# Patient Record
Sex: Male | Born: 1960 | Race: Black or African American | Hispanic: No | Marital: Married | Smoking: Former smoker
Health system: Southern US, Community
[De-identification: ages and names within clinical notes are randomized; demographics above are authoritative.]

## PROBLEM LIST (undated history)

## (undated) DIAGNOSIS — F419 Anxiety disorder, unspecified: Secondary | ICD-10-CM

## (undated) DIAGNOSIS — N529 Male erectile dysfunction, unspecified: Secondary | ICD-10-CM

## (undated) DIAGNOSIS — I10 Essential (primary) hypertension: Secondary | ICD-10-CM

## (undated) DIAGNOSIS — R079 Chest pain, unspecified: Secondary | ICD-10-CM

## (undated) DIAGNOSIS — E119 Type 2 diabetes mellitus without complications: Secondary | ICD-10-CM

## (undated) DIAGNOSIS — M797 Fibromyalgia: Secondary | ICD-10-CM

## (undated) DIAGNOSIS — I2510 Atherosclerotic heart disease of native coronary artery without angina pectoris: Secondary | ICD-10-CM

## (undated) DIAGNOSIS — E785 Hyperlipidemia, unspecified: Secondary | ICD-10-CM

## (undated) DIAGNOSIS — G4730 Sleep apnea, unspecified: Secondary | ICD-10-CM

## (undated) HISTORY — DX: Atherosclerotic heart disease of native coronary artery without angina pectoris: I25.10

## (undated) HISTORY — DX: Anxiety disorder, unspecified: F41.9

## (undated) HISTORY — PX: CHOLECYSTECTOMY: SHX55

## (undated) HISTORY — DX: Essential (primary) hypertension: I10

## (undated) HISTORY — DX: Hyperlipidemia, unspecified: E78.5

## (undated) HISTORY — DX: Chest pain, unspecified: R07.9

## (undated) HISTORY — DX: Fibromyalgia: M79.7

## (undated) HISTORY — DX: Sleep apnea, unspecified: G47.30

## (undated) HISTORY — DX: Male erectile dysfunction, unspecified: N52.9

## (undated) HISTORY — DX: Type 2 diabetes mellitus without complications: E11.9

---

## 1997-11-15 ENCOUNTER — Encounter: Payer: Self-pay | Admitting: Emergency Medicine

## 1997-11-15 ENCOUNTER — Ambulatory Visit (HOSPITAL_COMMUNITY): Admission: RE | Admit: 1997-11-15 | Discharge: 1997-11-15 | Payer: Self-pay

## 2001-06-14 ENCOUNTER — Encounter: Payer: Self-pay | Admitting: *Deleted

## 2001-06-14 ENCOUNTER — Ambulatory Visit: Admission: RE | Admit: 2001-06-14 | Discharge: 2001-06-15 | Payer: Self-pay | Admitting: *Deleted

## 2007-01-30 HISTORY — PX: CORONARY ANGIOPLASTY WITH STENT PLACEMENT: SHX49

## 2007-10-13 ENCOUNTER — Inpatient Hospital Stay (HOSPITAL_COMMUNITY): Admission: EM | Admit: 2007-10-13 | Discharge: 2007-10-15 | Payer: Self-pay | Admitting: Emergency Medicine

## 2008-05-29 HISTORY — PX: COLONOSCOPY: SHX174

## 2009-12-18 ENCOUNTER — Emergency Department (HOSPITAL_COMMUNITY): Admission: EM | Admit: 2009-12-18 | Discharge: 2009-12-18 | Payer: Self-pay | Admitting: Emergency Medicine

## 2010-06-13 NOTE — H&P (Signed)
NAME:  Clarence Jones, Clarence Jones NO.:  [id number]   MEDICAL RECORD NO.:  [id number]          PATIENT TYPE:  OBV   LOCATION:  3707                         FACILITY:  [facility]   PHYSICIAN:  Jake Bathe, MD      DATE OF BIRTH:  1960/07/09   DATE OF ADMISSION:  10/13/2007  DATE OF DISCHARGE:                              HISTORY & PHYSICAL   CHIEF COMPLAINT:  Consultation at the request of Dr. Wende Bushy  ED for the evaluation of chest pain.   HISTORY OF PRESENT ILLNESS:  A 50 year old African American male with  diabetes, hemoglobin A1c 6.0, hypertension well controlled with prior  stress test, nuclear, 1 year ago at the [facility] negative, who  presents to the emergency department today with increasing chest pain.  He walks daily and has noticed over the past few days an increase in  substernal chest pressure at the end of his walk.  He thought that it  may be gas.  Today at work, the pain intensified from 11 o'clock to  12:30 and then continued to 2 p.m.  It was 8/10 in intensity without any  radiation.  He did have some mild shortness of breath with this, but no  diaphoresis or nausea.  Occasional pounding/palpitation of the heart.  No syncope.  He complains of no bleeding problems.   Currently in the emergency department, he is resting comfortably beside  his daughter, doing well.   PAST MEDICAL HISTORY:  1. Diabetes - A1c 6.0.  2. Hypertension.  3. Obesity.  4. Hyperlipidemia - on pravastatin.  5. Prior tobacco use, quit 7 years ago.   PRIOR SURGICAL HISTORY:  Cholecystectomy in 1998.   ALLERGIES:  No known drug allergies.   MEDICATIONS:  1. Glipizide 2.5/metformin 250 mg twice a day.  2. Neurontin, unknown dosage.  3. Pravastatin 10 mg once a day.  4. Aspirin 81 mg once a day.  5. Viagra p.r.n.  6. Lisinopril/hydrochlorothiazide 20/25 mg once a day.   SOCIAL HISTORY:  No tobacco use, quit 7 years ago.  Rare alcohol use.  No drug use.  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  He currently works [place] the [place] fueling [place] with  [employer].   FAMILY HISTORY:  He has a daughter here with him.   FAMILY HISTORY:  Mother died of diabetes at age 97.  Father died of  pancreatic cancer at age 6.   REVIEW OF SYSTEMS:  Unless explained above, all other 12 review of  systems is negative.   PHYSICAL EXAMINATION:  VITAL SIGNS:  Pulse 65, blood pressure 138/82,  satting 100% on room air.  Occasional ectopy noted on telemetry.  GENERAL:  Alert and oriented x3 in no acute distress, resting  comfortably.  HEENT:  Eyes, well-perfused conjunctivae.  EOMI.  No scleral icterus.  NECK:  Thick.  No carotid bruits.  No JVD.  Normal upstroke.  CARDIOVASCULAR:  Regular rate and rhythm without any murmurs, rubs, or  gallops.  Normal PMI.  LUNGS:  Clear to auscultation bilaterally with normal  respiratory  effort.  ABDOMEN:  Obese.  Positive bowel sounds, nontender.  No bruits heard.  EXTREMITIES:  No clubbing, cyanosis, or edema.  Femoral pulses 2+  bilaterally with 2+ dorsalis pedis pulses bilaterally.  SKIN:  Warm,  dry, and intact.  No rashes noted.  NEUROLOGIC:  Nonfocal.  No tremors.  PSYCHIATRIC:  Normal affect.   LABORATORY DATA:  Sodium 140, potassium 3.8, BUN 17, creatinine 1.4,  glucose 199.  Hemoglobin 15, hematocrit 44.  First set of cardiac  biomarkers are normal.  White blood cell count 6.2, platelet count 409.  Chest x-ray shows no acute chest process.  This one was personally  reviewed.   ASSESSMENT AND PLAN:  1. Chest pain - concerning for unstable angina - earlier today, it was      relieved with nitroglycerin in the emergency department.  Given his      diabetes, hypertension, hyperlipidemia, obesity, and recent normal      nuclear stress test, I will proceed with cardiac catheterization in      the morning.  Risk and benefits including stroke, heart attack, and      death had been  explained to both the patient and his daughter at      length.  Currently, reassuring cardiac biomarkers.  We will go      ahead and place on heparin per pharmacy protocol.  We will also      place on low-dose metoprolol and continue lisinopril and      hydrochlorothiazide.  2. Hyperlipidemia - we will place on simvastatin 40 mg once a day in      lieu of his pravastatin 10 mg once a day.  May change as an      outpatient back to pravastatin.  We will check fasting lipid      profile.  3. Diabetes - we will place on insulin sliding scale.  Hold his      metformin.  4. Erectile dysfunction - counseled on Viagra use in the setting of      nitroglycerin.  He has not taken any Viagra recently.   CC: Dr Wende Bushy ED.      Jake Bathe, MD  Electronically Signed     MCS/MEDQ  D:  10/13/2007  T:  10/14/2007  Job:  [id number]

## 2010-06-13 NOTE — Discharge Summary (Signed)
NAME:  Clarence Jones, Clarence Jones NO.:  [id number]   MEDICAL RECORD NO.:  [id number]          PATIENT TYPE:  OBV   LOCATION:  6529                         FACILITY:  [facility]   PHYSICIAN:  Hollice Espy, M.D.DATE OF BIRTH:  Apr 05, 1960   DATE OF ADMISSION:  10/13/2007  DATE OF DISCHARGE:                               DISCHARGE SUMMARY   CONSULTANT ON THIS CASE:  Mark C. Anne Fu, MD, of cardiology.   PRIMARY CARE PHYSICIAN:  Oley Balm. Georgina Pillion, MD.   DISCHARGE DIAGNOSES:  1. Coronary artery disease status post cardiac catheterization with      placement of a drug-eluting stent to the mid-circumflex with no      complications.  2. Diabetes mellitus.  3. Hypertension.  4. Obesity.   DISCHARGE MEDICATIONS:  New medicine:  Plavix 75 p.o. daily x1 year.   The patient will continue all of his previous medications as follows:  1. Glipizide/metformin 2.5/250 p.o. b.i.d.  2. Pravachol 10 p.o. daily.  3. Aspirin 81 p.o. daily.  4. Viagra p.r.n.  5. Lisinopril 20 p.o. daily.  6. Hydrochlorothiazide 25 p.o. daily.   HOSPITAL COURSE:  The patient is a 50 year old African American male  with past medical history of diabetes and hypertension who came in  complaining of chest pain, which had been persisting for the past 12  hours prior to coming in.  He had had intermittent episodes and actually  had a stress test 1 year prior at an outside facility.  The patient was  not able to ever find out the results of that stress test but because of  the current episodes of pain, it was felt best that he go for cardiac  catheterization.  [facility] was consulted, saw the patient and  took him for cardiac catheterization today.  In the interim the patient  has had negative enzymes x3.  During the patient's cardiac  catheterization there were no complications.  He was found to have 70%  stenosis of the circumflex and he underwent a successful placement of a  drug-eluting stent.  Postop he  was seen in cath holding and then sent up  to the floor.  He tolerated it well and currently he is alert and  oriented with no complaints.  The plan will be to remove his sheath this  afternoon and discharge him 24 hours after stent placement.  The patient  has already been taking aspirin.  He was started on Plavix for 1 year's  time.  He will have a follow-up appointment with his cardiologist, Dr.  Donato Schultz of [facility], in the next 1-2 weeks as recommended  them and follow up with is PCP, Dr. Lajean Manes, in 2-3 weeks.  He is  cleared to return back to work in several days.  His activity will be  slow to increase.  His discharge diet will be a carb-modified, heart-  healthy diet.   DISPOSITION:  Improved.  He is being discharged to home.  The rest of  his medical issues were stable during this hospitalization.      Hollice Espy, M.D.  Electronically  Signed     SKK/MEDQ  D:  10/14/2007  T:  10/14/2007  Job:  [id number]   cc:   Jake Bathe, MD  Oley Balm Georgina Pillion, M.D.

## 2010-06-13 NOTE — H&P (Signed)
NAME:  Clarence Jones, Clarence Jones NO.:  [id number]   MEDICAL RECORD NO.:  [id number]          PATIENT TYPE:  EMS   LOCATION:  MAJO                         FACILITY:  [facility]   PHYSICIAN:  Hollice Espy, M.D.DATE OF BIRTH:  31-Aug-1960   DATE OF ADMISSION:  10/13/2007  DATE OF DISCHARGE:                              HISTORY & PHYSICAL   PRIMARY CARE PHYSICIAN:  Dr. Lajean Manes.   CONSULTANTS ON THIS CASE:  Dr. Donato Schultz, cardiology.   CHIEF COMPLAINT:  Chest pain.   HISTORY OF PRESENT ILLNESS:  The patient is a 50 year old African  American male with past medical history of hyperlipidemia, hypertension  and diabetes mellitus, who approximately a year ago at an outside city  and hospital through the [place] ended up getting a stress test for chest  pain.  He was never told the results, but assumed it was negative  because he never got a call back.  Since that time, he has had  intermittent episodes of chest pain which he describes as sharp pain  just left of the mid sternum.  He started having similar symptoms again  last night.  He went to work today, but then when the symptoms started  to come back and persist, he became concerned and called a friend who  advised him to come into the hospital.  In the emergency room, his first  set of cardiac markers were unremarkable.  He was given some aspirin,  which he said helped relieve some of these chest pain episodes.  He also  did note no radiation of this pain, but did note some associated  shortness of breath.  Currently, he is doing well.  He denies any  headaches, vision changes, dysphagia.  No current chest pain, no  palpitations.  No shortness of breath, wheeze or cough currently.  No  abdominal pain.  No hematuria, dysuria, constipation, diarrhea, focal  extremity numbness, weakness or pain.  The patient had lab work done,  all of which is also unremarkable as well.  His EKG showed a normal  sinus rhythm.   PAST MEDICAL  HISTORY:  1. Diabetes mellitus.  2. Hypertension.  3. Hyperlipidemia.   MEDICATIONS:  1. Glipizide 2.5/250 for metformin p.o. b.i.d.  2. Neurontin p.o. daily.  3. Pravastatin 10 p.o. daily.  4. Aspirin 81.  5. Viagra p.r.n.  6. Lisinopril/hydrochlorothiazide 20/25 p.o. daily.   ALLERGIES:  HE HAS NO KNOWN DRUG ALLERGIES.   SOCIAL HISTORY:  No tobacco, alcohol or drug use.   FAMILY HISTORY:  Positive for cancer, not for CAD.   PHYSICAL EXAMINATION:  VITAL SIGNS:  On admission, temperature 98.1,  heart rate 67, blood pressure 132/76, respirations 18, O2 sat 99% on 2  liters.  GENERAL:  He is alert and oriented x3 in no apparent distress.  HEENT:  Normocephalic, atraumatic.  Mucous membranes are moist.  NECK:  No carotid bruits.  HEART:  Regular rate and rhythm.  S1-S2.  LUNGS:  Clear to auscultation bilaterally.  ABDOMEN:  Soft, nontender, nondistended.  Positive bowel sounds.  EXTREMITIES:  Show no clubbing or cyanosis.  He  has trace pitting edema.   LABORATORY DATA:  Chest x-ray shows no acute chest process.  EKG is as  per HPI.  White count is 6.2.  Hemoglobin and hematocrit 14.3 and 42.  MCV of 90, platelet count 409, no shift.  CPK 53.7, MB less than 1.  Troponin-I less than 0.05.  Sodium 140, potassium 3.8, chloride 105, BUN  17, creatinine 1.4, glucose 99.   ASSESSMENT/PLAN:  1. Chest pain in a patient with history of diabetes, hyperlipidemia,      hypertension, recurrent with previously questionable negative      stress test 1-year ago in a different city.  I discussed with Dr.      Dawna Part of cardiology.  He will see the patient and likely the      patient will go for cardiac catheterization in the a.m.  2. Diabetes mellitus.  Continue medications, sliding scale.  Change to      q.4 h., sliding scale when he is n.p.o.  3. Hypertension.  Continue medications.  4. Renal insufficiency - mild, likely chronic.      Hollice Espy, M.D.  Electronically  Signed     SKK/MEDQ  D:  10/13/2007  T:  10/13/2007  Job:  [id number]   cc:   Oley Balm. Georgina Pillion, M.D.  Dawna Part, MD

## 2010-10-30 LAB — HEMOGLOBIN A1C
Hgb A1c MFr Bld: 6.2 — ABNORMAL HIGH
Mean Plasma Glucose: 131

## 2010-10-30 LAB — BASIC METABOLIC PANEL
BUN: 13
BUN: 9
CO2: 30
Calcium: 9.2
Calcium: 9.3
Chloride: 106
Creatinine, Ser: 1.13
GFR calc Af Amer: >60
GFR calc non Af Amer: >60
GFR calc non Af Amer: >60
Glucose, Bld: 105 — ABNORMAL HIGH
Glucose, Bld: 107 — ABNORMAL HIGH
Potassium: 3.5
Potassium: 3.8
Sodium: 137
Sodium: 139

## 2010-10-30 LAB — LIPID PANEL
Cholesterol: 207 — ABNORMAL HIGH
HDL: 35 — ABNORMAL LOW
LDL Cholesterol: 151 — ABNORMAL HIGH
Triglycerides: 103

## 2010-10-30 LAB — CBC
HCT: 43.5
HCT: 43.6
MCHC: 33.3
MCV: 91
Platelets: 357
Platelets: 380
RDW: 12.6
WBC: 6.9

## 2010-10-30 LAB — GLUCOSE, CAPILLARY
Glucose-Capillary: 107 — ABNORMAL HIGH
Glucose-Capillary: 90

## 2010-10-30 LAB — HEPARIN LEVEL (UNFRACTIONATED): Heparin Unfractionated: 1.11 — ABNORMAL HIGH

## 2010-11-01 LAB — MAGNESIUM: Magnesium: 2.1

## 2010-11-01 LAB — CK TOTAL AND CKMB (NOT AT ARMC)
CK, MB: 1.1
Relative Index: 0.3
Total CK: 421 — ABNORMAL HIGH

## 2010-11-01 LAB — DIFFERENTIAL
Basophils Absolute: 0
Lymphocytes Relative: 28
Lymphs Abs: 1.7
Neutro Abs: 4
Neutrophils Relative %: 65

## 2010-11-01 LAB — COMPREHENSIVE METABOLIC PANEL
AST: 20
CO2: 29
Chloride: 106
Creatinine, Ser: 1.18
GFR calc Af Amer: >60
GFR calc non Af Amer: >60
Glucose, Bld: 104 — ABNORMAL HIGH
Total Bilirubin: 1.1

## 2010-11-01 LAB — CBC
Platelets: 409 — ABNORMAL HIGH
RDW: 13
WBC: 6.2

## 2010-11-01 LAB — POCT I-STAT, CHEM 8
BUN: 17
Chloride: 105
Creatinine, Ser: 1.4
Potassium: 3.8
Sodium: 140
TCO2: 27

## 2010-11-01 LAB — PROTIME-INR: INR: 1

## 2010-11-01 LAB — POCT CARDIAC MARKERS
CKMB, poc: <1 — ABNORMAL LOW
Myoglobin, poc: 53.7
Troponin i, poc: <0.05

## 2011-05-10 IMAGING — CR DG HAND COMPLETE 3+V*R*
3 series · 3 of 3 positions shown · non-contrast
Comparison: None.

CLINICAL DATA: Laceration of the index finger.

RIGHT HAND - COMPLETE 3+ VIEW

[x hand ap right]
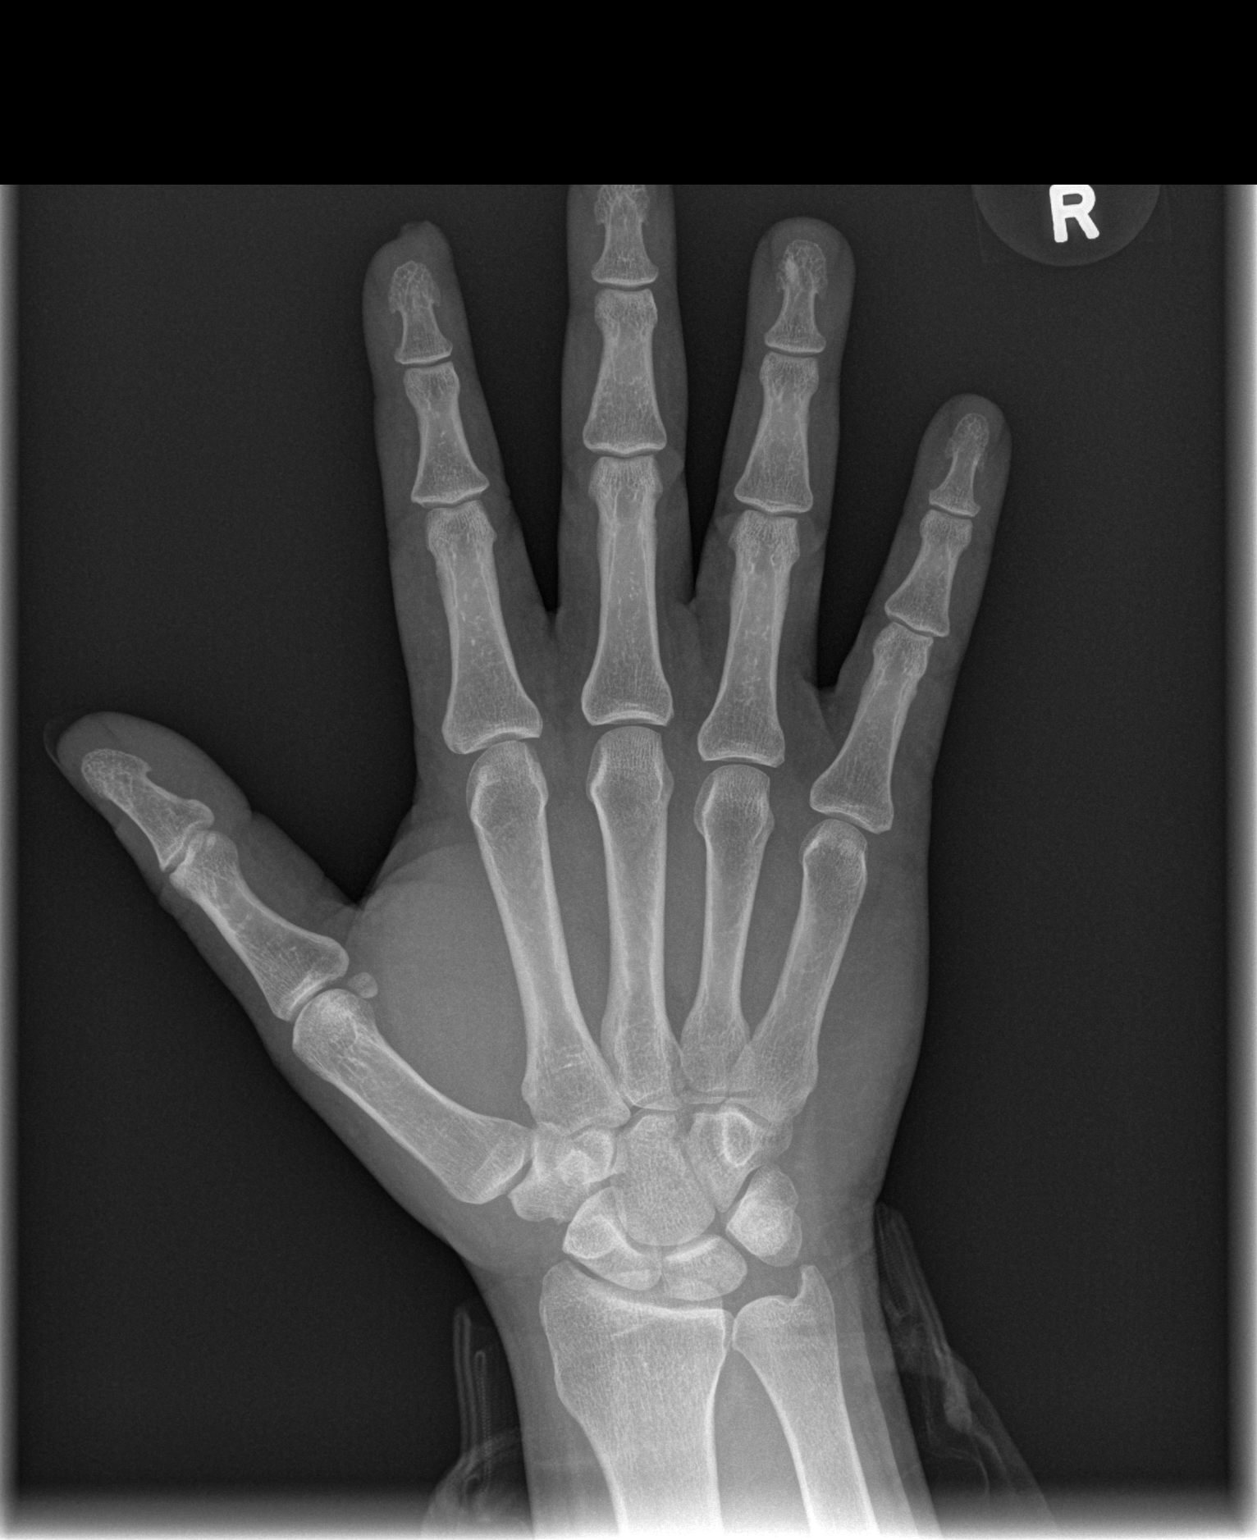

[x hand oblique right]
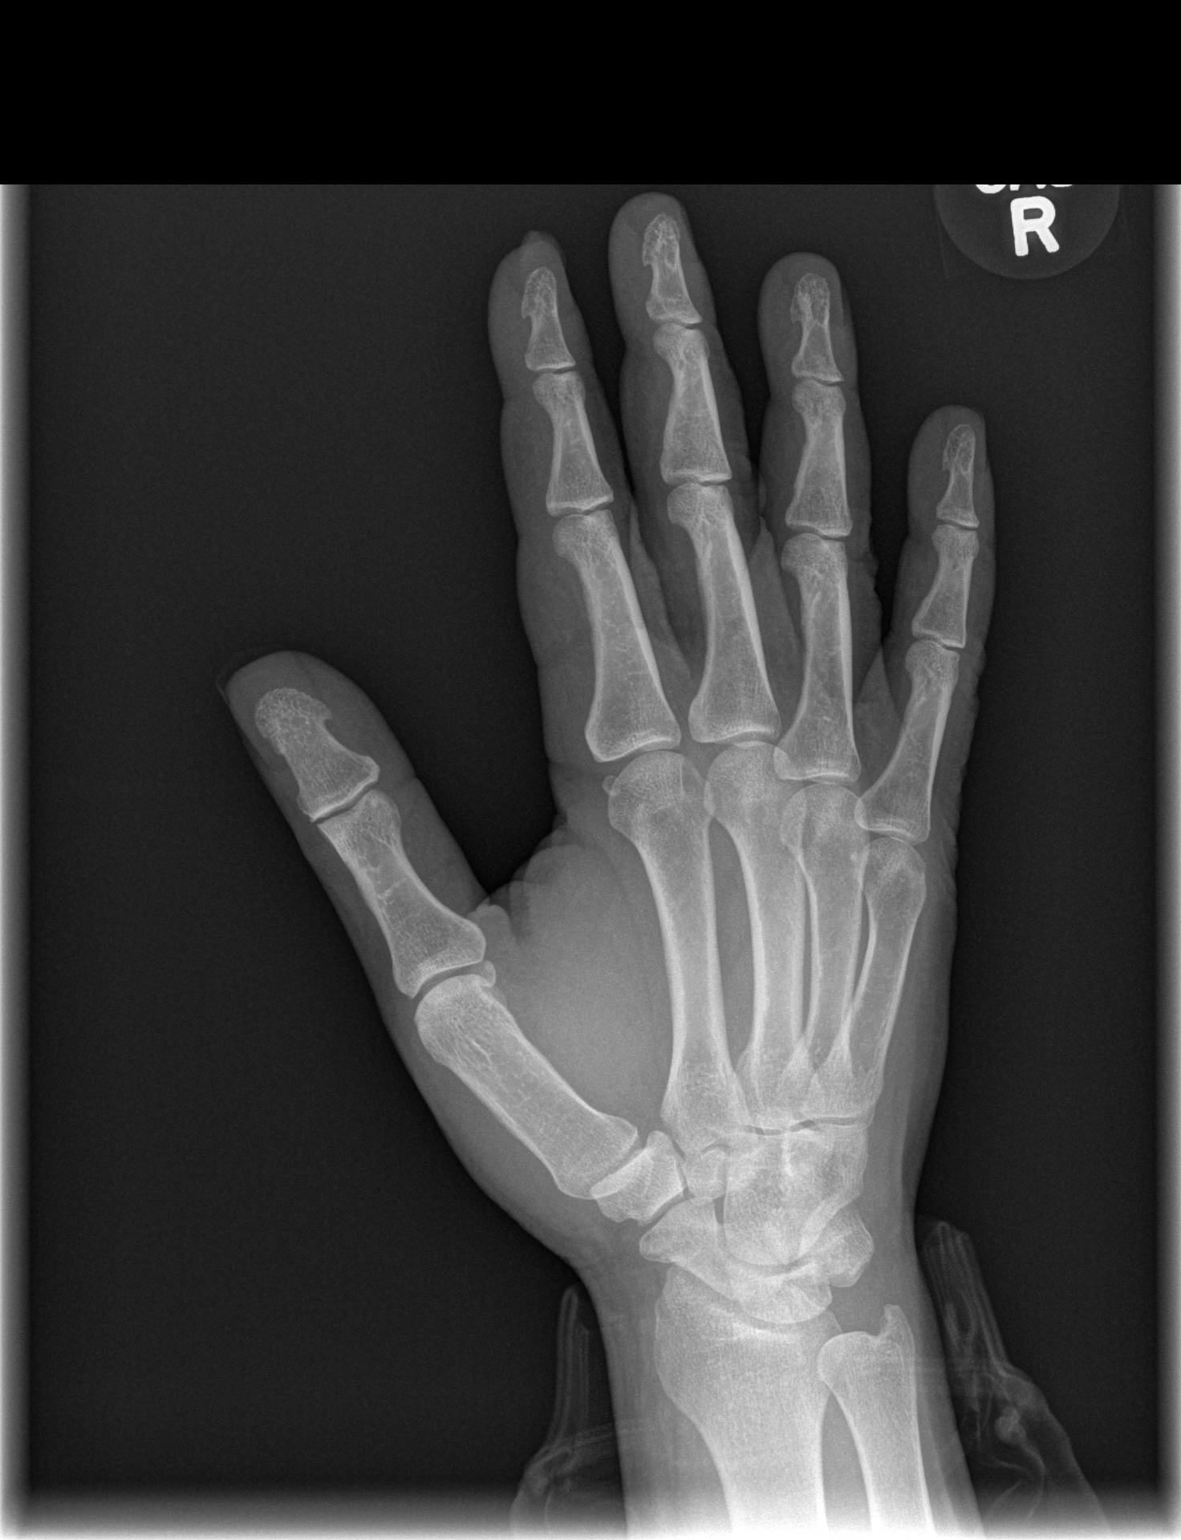

[x hand lat right]
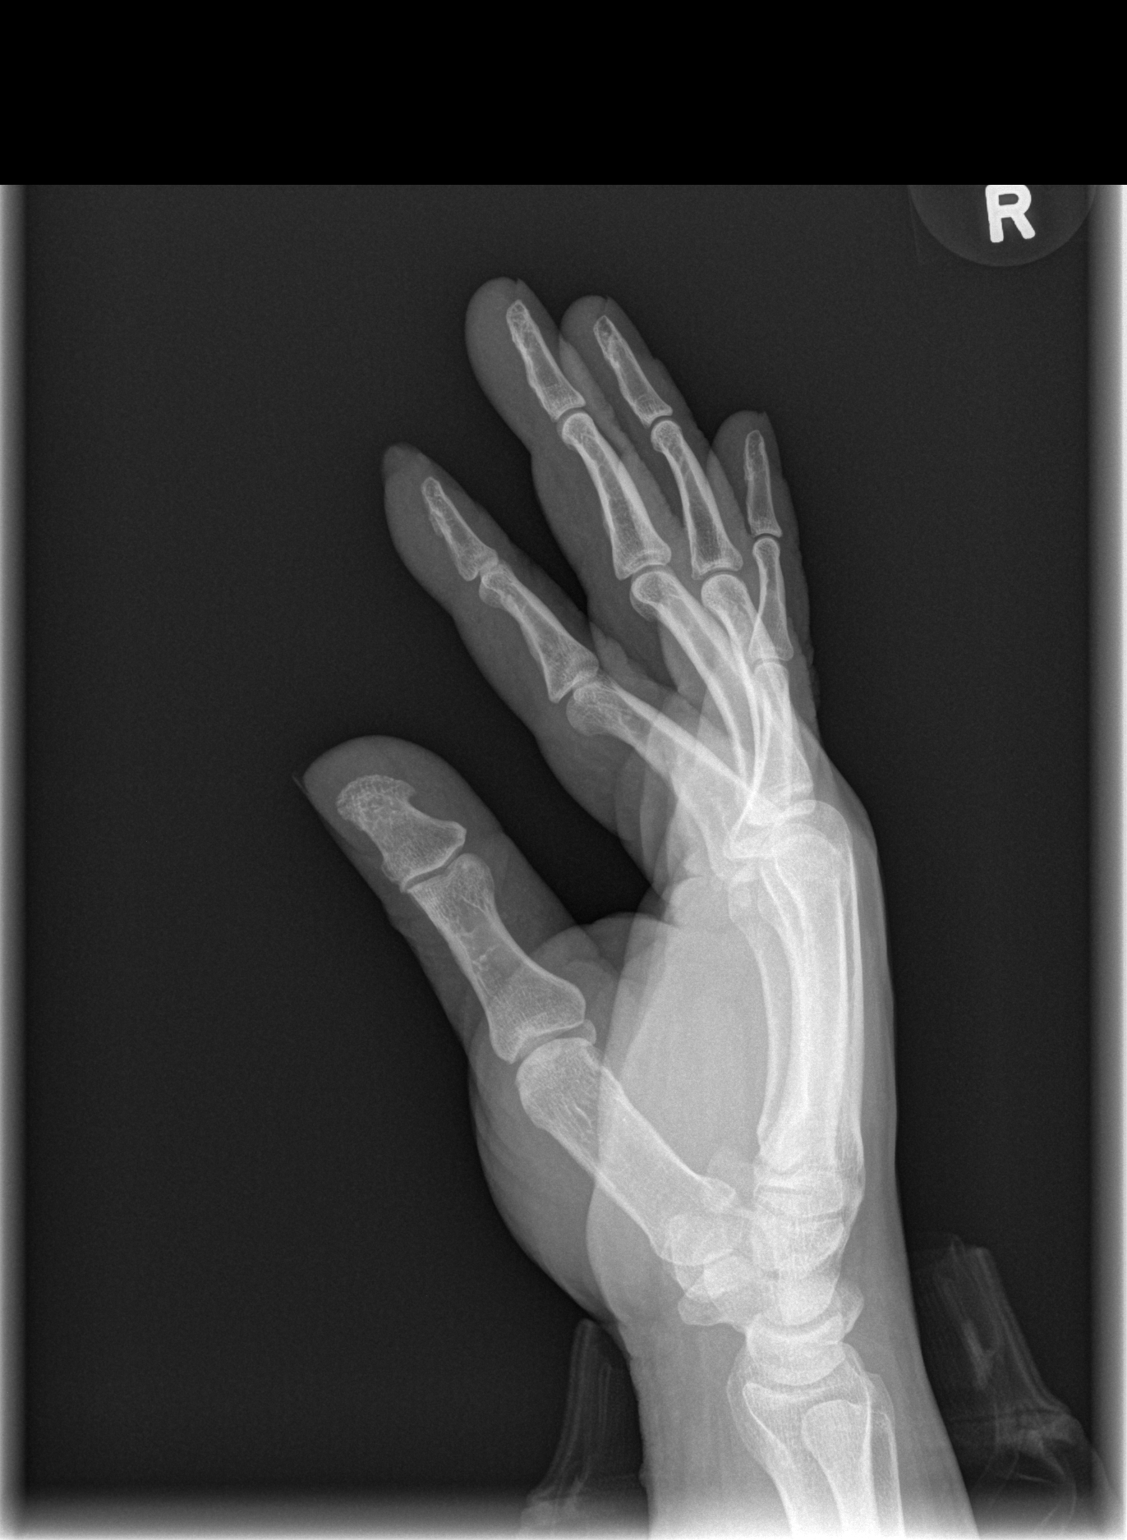

[3 of 3 positions shown; findings below may reference images not displayed]

FINDINGS: Soft tissue defect is seen over the tip of the index
finger.  No underlying fracture or foreign body.
IMPRESSION: Laceration of the index finger.  No fracture or foreign body.

## 2012-11-27 ENCOUNTER — Telehealth: Payer: Self-pay

## 2012-11-27 MED ORDER — LISINOPRIL-HYDROCHLOROTHIAZIDE 20-25 MG PO TABS: 1.0000 | ORAL_TABLET | Freq: Every day | ORAL | Status: DC

## 2012-11-27 NOTE — Telephone Encounter (Signed)
refill 

## 2012-12-23 ENCOUNTER — Telehealth: Payer: Self-pay

## 2012-12-23 DIAGNOSIS — E785 Hyperlipidemia, unspecified: Secondary | ICD-10-CM

## 2012-12-23 MED ORDER — ATORVASTATIN CALCIUM 40 MG PO TABS: 40.0000 mg | ORAL_TABLET | Freq: Every day | ORAL | Status: DC

## 2012-12-23 NOTE — Telephone Encounter (Signed)
Done

## 2013-01-06 ENCOUNTER — Encounter: Payer: Self-pay | Admitting: *Deleted

## 2013-01-06 ENCOUNTER — Encounter: Payer: Self-pay | Admitting: Cardiology

## 2013-01-06 DIAGNOSIS — I2510 Atherosclerotic heart disease of native coronary artery without angina pectoris: Secondary | ICD-10-CM | POA: Insufficient documentation

## 2013-01-06 DIAGNOSIS — R079 Chest pain, unspecified: Secondary | ICD-10-CM | POA: Insufficient documentation

## 2013-01-06 DIAGNOSIS — I10 Essential (primary) hypertension: Secondary | ICD-10-CM | POA: Insufficient documentation

## 2013-01-06 DIAGNOSIS — E785 Hyperlipidemia, unspecified: Secondary | ICD-10-CM | POA: Insufficient documentation

## 2013-01-06 DIAGNOSIS — E119 Type 2 diabetes mellitus without complications: Secondary | ICD-10-CM | POA: Insufficient documentation

## 2013-01-06 DIAGNOSIS — N529 Male erectile dysfunction, unspecified: Secondary | ICD-10-CM | POA: Insufficient documentation

## 2013-01-07 ENCOUNTER — Encounter: Payer: Self-pay | Admitting: Cardiology

## 2013-01-07 ENCOUNTER — Ambulatory Visit (INDEPENDENT_AMBULATORY_CARE_PROVIDER_SITE_OTHER): Payer: BC Managed Care – PPO | Admitting: Cardiology

## 2013-01-07 VITALS — BP 136/80 | HR 77 | Ht 68.0 in | Wt 201.0 lb

## 2013-01-07 DIAGNOSIS — E119 Type 2 diabetes mellitus without complications: Secondary | ICD-10-CM

## 2013-01-07 DIAGNOSIS — I10 Essential (primary) hypertension: Secondary | ICD-10-CM

## 2013-01-07 DIAGNOSIS — E785 Hyperlipidemia, unspecified: Secondary | ICD-10-CM

## 2013-01-07 DIAGNOSIS — I2510 Atherosclerotic heart disease of native coronary artery without angina pectoris: Secondary | ICD-10-CM

## 2013-01-07 NOTE — Patient Instructions (Signed)
Your physician recommends that you continue on your current medications as directed. Please refer to the Current Medication list given to you today.  Your physician recommends that you schedule a follow-up appointment in: 3 months with Dr. Skians 

## 2013-01-07 NOTE — Progress Notes (Signed)
[address] [place], [place]  [address] Phone: [phone] Fax:  [phone]  Date:  01/07/2013   ID:  Clarence Jones, DOB 11/19/1960, MRN [id number]  PCP:  No primary provider on file.   History of Present Illness: Clarence Jones is a 52 y.o. male with coronary artery disease, proximal stent in the circumflex artery placed in September 2009, diabetes, hypertension, hyperlipidemia here for followup. He's not having any exertional anginal symptoms, doing well. He is compliant with his medications. He is quite active, lifts weights. His hemoglobin A1c is much better at 6.5.  We discussed his creatinine last check of 1.38. Avoidance of NSAIDs. We discussed his weight gain of 7 pounds. He has been working out. No anginal symptoms. He does have occasional shortness of breath when going up stairs but this is no change for him. He is able to exercise quite vigorously on treadmill without any difficulty. Still weightlifting.  Sleep study poor. Getting fitting.     Wt Readings from Last 3 Encounters:  01/07/13 201 lb (91.173 kg)     Past Medical History  Diagnosis Date  . Fibromyalgia   . Coronary atherosclerosis of native coronary artery   . HTN (hypertension)   . Hyperlipidemia   . Erectile dysfunction   . Diabetes   . HTN (hypertension)   . Chest pain     ETT 12/09 - 10 min. no ECG changes.     Past Surgical History  Procedure Laterality Date  . Cholecystectomy      Current Outpatient Prescriptions  Medication Sig Dispense Refill  . aspirin 81 MG tablet Take 81 mg by mouth daily.      Marland Kitchen aspirin-acetaminophen-caffeine (EXCEDRIN MIGRAINE) 250-250-65 MG per tablet Take 1 tablet by mouth as needed for headache.      Marland Kitchen atorvastatin (LIPITOR) 40 MG tablet Take 1 tablet (40 mg total) by mouth daily.  90 tablet  1  . carvedilol (COREG) 6.25 MG tablet Take 6.25 mg by mouth 2 (two) times daily with a meal.      . cetirizine (ZYRTEC) 10 MG tablet Take 10 mg by  mouth daily as needed for allergies.      Marland Kitchen gabapentin (NEURONTIN) 300 MG capsule Take 300 mg by mouth 3 (three) times daily.      Marland Kitchen glipiZIDE (GLUCOTROL) 5 MG tablet Take 5 mg by mouth daily before breakfast.      . hydrocortisone (ANUSOL-HC) 2.5 % rectal cream Place 1 application rectally 2 (two) times daily.      Marland Kitchen lisinopril-hydrochlorothiazide (PRINZIDE,ZESTORETIC) 20-25 MG per tablet Take 1 tablet by mouth daily.  30 tablet  5  . metFORMIN (GLUCOPHAGE) 500 MG tablet Take 2,000 mg by mouth daily.      . naproxen (NAPROSYN) 500 MG tablet Take 500 mg by mouth as needed.      . nitroGLYCERIN (NITROSTAT) 0.4 MG SL tablet Place 0.4 mg under the tongue every 5 (five) minutes as needed for chest pain.      . sildenafil (VIAGRA) 100 MG tablet Take 100 mg by mouth daily as needed for erectile dysfunction.      . traMADol (ULTRAM) 50 MG tablet Take 50 mg by mouth every 6 (six) hours as needed.       No current facility-administered medications for this visit.    Allergies:   No Known Allergies  Social History:  The patient  reports that he has quit smoking. He does not have any  smokeless tobacco history on file. He reports that he drinks alcohol. He reports that he does not use illicit drugs.   ROS:  Please see the history of present illness.   Denies any bleeding, syncope, orthopnea, PND  PHYSICAL EXAM: VS:  BP 136/80  Pulse 77  Ht 5\' 8"  (1.727 m)  Wt 201 lb (91.173 kg)  BMI 30.57 kg/m2 Well nourished, well developed, in no acute distress HEENT: normal Neck: no JVD Cardiac:  normal S1, S2; RRR; no murmur Lungs:  clear to auscultation bilaterally, no wheezing, rhonchi or rales Abd: soft, nontender, no hepatomegaly Ext: no edema Skin: warm and dry Neuro: no focal abnormalities noted  EKG:  Sinus rhythm, T wave inversion in various leads, no change from prior     ASSESSMENT AND PLAN:  1. OSA - getting fitting CPAP.  2. CAD - stable. Circumflex stent. No Angina.  3. HTN - OK with  current readings.  4. DM - diet controlled. Glipizide and metformin. 6.8 from 7.4. 5. HL - atorvastatin 40mg . LDL 83 at last check.  6. Three-month followup  Signed, Donato Schultz, MD [facility]  01/07/2013 10:16 AM

## 2013-04-07 ENCOUNTER — Ambulatory Visit (INDEPENDENT_AMBULATORY_CARE_PROVIDER_SITE_OTHER): Payer: BC Managed Care – PPO | Admitting: Cardiology

## 2013-04-07 ENCOUNTER — Encounter: Payer: Self-pay | Admitting: Cardiology

## 2013-04-07 VITALS — BP 148/85 | HR 67 | Ht 68.0 in | Wt 201.0 lb

## 2013-04-07 DIAGNOSIS — E119 Type 2 diabetes mellitus without complications: Secondary | ICD-10-CM

## 2013-04-07 DIAGNOSIS — N529 Male erectile dysfunction, unspecified: Secondary | ICD-10-CM

## 2013-04-07 DIAGNOSIS — I2510 Atherosclerotic heart disease of native coronary artery without angina pectoris: Secondary | ICD-10-CM

## 2013-04-07 DIAGNOSIS — E785 Hyperlipidemia, unspecified: Secondary | ICD-10-CM

## 2013-04-07 DIAGNOSIS — I10 Essential (primary) hypertension: Secondary | ICD-10-CM

## 2013-04-07 MED ORDER — LISINOPRIL-HYDROCHLOROTHIAZIDE 20-25 MG PO TABS: 1.0000 | ORAL_TABLET | Freq: Every day | ORAL | Status: DC

## 2013-04-07 MED ORDER — ATORVASTATIN CALCIUM 40 MG PO TABS: 40.0000 mg | ORAL_TABLET | Freq: Every day | ORAL | Status: DC

## 2013-04-07 MED ORDER — CARVEDILOL 6.25 MG PO TABS: 6.2500 mg | ORAL_TABLET | Freq: Two times a day (BID) | ORAL | Status: DC

## 2013-04-07 NOTE — Patient Instructions (Signed)
Your physician recommends that you continue on your current medications as directed. Please refer to the Current Medication list given to you today.  Your physician wants you to follow-up in: 6 months with Dr. Skains. You will receive a reminder letter in the mail two months in advance. If you don't receive a letter, please call our office to schedule the follow-up appointment.  

## 2013-04-07 NOTE — Progress Notes (Signed)
[place], [address] [place], [place]  [address] Phone: [phone] Fax:  [phone]  Date:  04/07/2013   ID:  Clarence Jones, DOB [birth year], MRN [id number]  PCP:  No primary provider on file.   History of Present Illness: Clarence Jones is a 53 y.o. male with coronary artery disease, proximal stent in the circumflex artery placed in September 2009, diabetes, hypertension, hyperlipidemia here for followup. He's not having any exertional anginal symptoms, doing well. He is compliant with his medications. He is quite active, lifts weights. His hemoglobin A1c is much better at 6.5.  We discussed his creatinine last check of 1.38. Avoidance of NSAIDs. We discussed his weight gain of 7 pounds. He has been working out. No anginal symptoms. He does have occasional shortness of breath when going up stairs but this is no change for him. He is able to exercise quite vigorously on treadmill without any difficulty. Still weightlifting.  Sleep study poor. Getting fitting.     Wt Readings from Last 3 Encounters:  04/07/13 201 lb (91.173 kg)  01/07/13 201 lb (91.173 kg)     Past Medical History  Diagnosis Date  . Fibromyalgia   . Coronary atherosclerosis of native coronary artery   . HTN (hypertension)   . Hyperlipidemia   . Erectile dysfunction   . Diabetes   . HTN (hypertension)   . Chest pain     ETT 12/09 - 10 min. no ECG changes.     Past Surgical History  Procedure Laterality Date  . Cholecystectomy      Current Outpatient Prescriptions  Medication Sig Dispense Refill  . aspirin 81 MG tablet Take 81 mg by mouth daily.      Marland Kitchen aspirin-acetaminophen-caffeine (EXCEDRIN MIGRAINE) 250-250-65 MG per tablet Take 1 tablet by mouth as needed for headache.      Marland Kitchen atorvastatin (LIPITOR) 40 MG tablet Take 1 tablet (40 mg total) by mouth daily.  90 tablet  1  . carvedilol (COREG) 6.25 MG tablet Take 6.25 mg by mouth 2 (two) times daily with a meal.      . cetirizine  (ZYRTEC) 10 MG tablet Take 10 mg by mouth daily as needed for allergies.      Marland Kitchen gabapentin (NEURONTIN) 300 MG capsule Take 300 mg by mouth 3 (three) times daily.      Marland Kitchen glipiZIDE (GLUCOTROL) 5 MG tablet Take 5 mg by mouth daily before breakfast.      . hydrocortisone (ANUSOL-HC) 2.5 % rectal cream Place 1 application rectally 2 (two) times daily.      Marland Kitchen lisinopril-hydrochlorothiazide (PRINZIDE,ZESTORETIC) 20-25 MG per tablet Take 1 tablet by mouth daily.  30 tablet  5  . metFORMIN (GLUCOPHAGE) 500 MG tablet Take 2,000 mg by mouth daily.      . naproxen (NAPROSYN) 500 MG tablet Take 500 mg by mouth as needed.      . nitroGLYCERIN (NITROSTAT) 0.4 MG SL tablet Place 0.4 mg under the tongue every 5 (five) minutes as needed for chest pain.      . sildenafil (VIAGRA) 100 MG tablet Take 100 mg by mouth daily as needed for erectile dysfunction.      . traMADol (ULTRAM) 50 MG tablet Take 50 mg by mouth every 6 (six) hours as needed.       No current facility-administered medications for this visit.    Allergies:   No Known Allergies  Social History:  The patient  reports that he has  quit smoking. He does not have any smokeless tobacco history on file. He reports that he drinks alcohol. He reports that he does not use illicit drugs.   ROS:  Please see the history of present illness.   Denies any bleeding, syncope, orthopnea, PND  PHYSICAL EXAM: VS:  BP 148/85  Pulse 67  Ht 5\' 8"  (1.727 m)  Wt 201 lb (91.173 kg)  BMI 30.57 kg/m2 Well nourished, well developed, in no acute distress HEENT: normal Neck: no JVD Cardiac:  normal S1, S2; RRR; no murmur Lungs:  clear to auscultation bilaterally, no wheezing, rhonchi or rales Abd: soft, nontender, no hepatomegaly Ext: no edema Skin: warm and dry Neuro: no focal abnormalities noted  EKG:  Sinus rhythm, T wave inversion in various leads, no change from prior     ASSESSMENT AND PLAN:  1. OSA - CPAP.  2. CAD - stable. Circumflex stent. No Angina.  Doing well. 3. HTN - OK with current readings. Mildly elevated today. He has been rationing his medications. We will give him refills. 4. DM - diet controlled. Glipizide and metformin. 6.8 from 7.4. 5. HL - atorvastatin 40mg . LDL 83 at last check.  6. Six-month followup  Signed, Candee Furbish, MD [facility]  04/07/2013 8:09 AM

## 2013-10-26 ENCOUNTER — Ambulatory Visit: Payer: BC Managed Care – PPO | Admitting: Cardiology

## 2013-12-01 ENCOUNTER — Ambulatory Visit: Payer: BC Managed Care – PPO | Admitting: Cardiology

## 2013-12-28 ENCOUNTER — Encounter: Payer: Self-pay | Admitting: Cardiology

## 2013-12-28 ENCOUNTER — Ambulatory Visit (INDEPENDENT_AMBULATORY_CARE_PROVIDER_SITE_OTHER): Payer: BC Managed Care – PPO | Admitting: Cardiology

## 2013-12-28 VITALS — BP 122/88 | HR 74 | Ht 68.0 in | Wt 196.4 lb

## 2013-12-28 DIAGNOSIS — I10 Essential (primary) hypertension: Secondary | ICD-10-CM

## 2013-12-28 DIAGNOSIS — I2510 Atherosclerotic heart disease of native coronary artery without angina pectoris: Secondary | ICD-10-CM

## 2013-12-28 DIAGNOSIS — E119 Type 2 diabetes mellitus without complications: Secondary | ICD-10-CM

## 2013-12-28 DIAGNOSIS — E785 Hyperlipidemia, unspecified: Secondary | ICD-10-CM

## 2013-12-28 NOTE — Patient Instructions (Signed)
The current medical regimen is effective;  continue present plan and medications.  Follow up in 6 months with Dr. Skains.  You will receive a letter in the mail 2 months before you are due.  Please call us when you receive this letter to schedule your follow up appointment.  

## 2013-12-28 NOTE — Progress Notes (Signed)
[place]. [address], [address] [place], [place]  [address] Phone: [phone] Fax:  [phone]  Date:  12/28/2013   ID:  Clarence Jones, DOB 26-Jan-1961, MRN [id number]  PCP:  No primary care provider on file.   History of Present Illness: Clarence Jones is a 53 y.o. male with coronary artery disease, proximal stent in the circumflex artery placed in September 2009, diabetes, hypertension, hyperlipidemia here for followup.   He's not having any exertional anginal symptoms, doing well. He is compliant with his medications. He is quite active, lifts weights. His hemoglobin A1c is much better from 8.4 to 7.0.   We previously discussed his creatinine last check of 1.38. Avoidance of NSAIDs. He has been working out. No anginal symptoms. He is able to exercise quite vigorously on treadmill without any difficulty. Still weightlifting.  Enjoys Jearld Shines tea.  Sleep study poor. CPAP. Unfortunately he lost his daughter, headache, meningitis.    Wt Readings from Last 3 Encounters:  12/28/13 196 lb 6.4 oz (89.086 kg)  04/07/13 201 lb (91.173 kg)  01/07/13 201 lb (91.173 kg)     Past Medical History  Diagnosis Date  . Fibromyalgia   . Coronary atherosclerosis of native coronary artery   . HTN (hypertension)   . Hyperlipidemia   . Erectile dysfunction   . Diabetes   . HTN (hypertension)   . Chest pain     ETT 12/09 - 10 min. no ECG changes.     Past Surgical History  Procedure Laterality Date  . Cholecystectomy      Current Outpatient Prescriptions  Medication Sig Dispense Refill  . aspirin 81 MG tablet Take 81 mg by mouth daily.    Marland Kitchen aspirin-acetaminophen-caffeine (EXCEDRIN MIGRAINE) 250-250-65 MG per tablet Take 1 tablet by mouth as needed for headache.    Marland Kitchen atorvastatin (LIPITOR) 40 MG tablet Take 1 tablet (40 mg total) by mouth daily. 30 tablet 7  . carvedilol (COREG) 6.25 MG tablet Take 1 tablet (6.25 mg total) by mouth 2 (two) times daily with a meal. 60 tablet 7    . cetirizine (ZYRTEC) 10 MG tablet Take 10 mg by mouth daily as needed for allergies.    Marland Kitchen gabapentin (NEURONTIN) 300 MG capsule Take 300 mg by mouth 3 (three) times daily.    Marland Kitchen glipiZIDE (GLUCOTROL) 5 MG tablet Take 5 mg by mouth daily before breakfast.    . hydrocortisone (ANUSOL-HC) 2.5 % rectal cream Place 1 application rectally 2 (two) times daily.    Marland Kitchen lisinopril-hydrochlorothiazide (PRINZIDE,ZESTORETIC) 20-25 MG per tablet Take 1 tablet by mouth daily. 30 tablet 7  . metFORMIN (GLUCOPHAGE) 500 MG tablet Take 2,000 mg by mouth daily.    . naproxen (NAPROSYN) 500 MG tablet Take 500 mg by mouth as needed.    . nitroGLYCERIN (NITROSTAT) 0.4 MG SL tablet Place 0.4 mg under the tongue every 5 (five) minutes as needed for chest pain.    . sildenafil (VIAGRA) 100 MG tablet Take 100 mg by mouth daily as needed for erectile dysfunction.    . traMADol (ULTRAM) 50 MG tablet Take 50 mg by mouth every 6 (six) hours as needed.     No current facility-administered medications for this visit.    Allergies:   No Known Allergies  Social History:  The patient  reports that he has quit smoking. He does not have any smokeless tobacco history on file. He reports that he drinks alcohol. He reports that he does not  use illicit drugs.   ROS:  Please see the history of present illness.   Denies any bleeding, syncope, orthopnea, PND  PHYSICAL EXAM: VS:  BP 122/88 mmHg  Pulse 74  Ht 5\' 8"  (1.727 m)  Wt 196 lb 6.4 oz (89.086 kg)  BMI 29.87 kg/m2 Well nourished, well developed, in no acute distress HEENT: normal Neck: no JVD Cardiac:  normal S1, S2; RRR; no murmur Lungs:  clear to auscultation bilaterally, no wheezing, rhonchi or rales Abd: soft, nontender, no hepatomegaly Ext: no edema Skin: warm and dry Neuro: no focal abnormalities noted  EKG:  01/07/13-Sinus rhythm, T wave inversion in various leads, no change from prior    12/28/13-sinus rhythm, 74, T wave inversions inferiorly, laterally, no  significant change from prior   ASSESSMENT AND PLAN:  1. OSA - CPAP.  2. CAD - stable. Circumflex stent. No Angina. Doing well. EKG with no change from prior. He is not having any anginal symptoms with exercise. 3. HTN - OK with current readings. Would like for his diastolic to be less than 85.  4. DM - diet controlled. Glipizide and metformin. 7.0. 5. HL - atorvastatin 40mg . LDL 83 at last check.  6. Six-month followup  Signed, Candee Furbish, MD [facility]  12/28/2013 9:24 AM

## 2014-04-10 ENCOUNTER — Other Ambulatory Visit: Payer: Self-pay | Admitting: Cardiology

## 2014-06-29 ENCOUNTER — Encounter: Payer: Self-pay | Admitting: Cardiology

## 2014-06-29 ENCOUNTER — Ambulatory Visit (INDEPENDENT_AMBULATORY_CARE_PROVIDER_SITE_OTHER): Payer: BLUE CROSS/BLUE SHIELD | Admitting: Cardiology

## 2014-06-29 VITALS — BP 130/80 | HR 78 | Ht 68.0 in | Wt 197.1 lb

## 2014-06-29 DIAGNOSIS — I2510 Atherosclerotic heart disease of native coronary artery without angina pectoris: Secondary | ICD-10-CM | POA: Diagnosis not present

## 2014-06-29 DIAGNOSIS — I10 Essential (primary) hypertension: Secondary | ICD-10-CM | POA: Diagnosis not present

## 2014-06-29 DIAGNOSIS — E785 Hyperlipidemia, unspecified: Secondary | ICD-10-CM | POA: Diagnosis not present

## 2014-06-29 MED ORDER — LOSARTAN POTASSIUM-HCTZ 100-25 MG PO TABS: 1.0000 | ORAL_TABLET | Freq: Every day | ORAL | Status: DC

## 2014-06-29 NOTE — Progress Notes (Signed)
[place]. [address] [place], [place]  [address] Phone: [phone] Fax:  [phone]  Date:  06/29/2014   ID:  Clarence Jones, DOB 1960-08-23, MRN [id number]  PCP:  No primary care provider on file.   History of Present Illness: Clarence Jones is a 55 y.o. male with coronary artery disease, proximal stent in the circumflex artery placed in September 2009, diabetes, hypertension, hyperlipidemia here for followup.   He's not having any exertional anginal symptoms, doing well. He is compliant with his medications. He is quite active, lifts weights. His hemoglobin A1c is much better from 8.4 to 6.8.   We previously discussed his creatinine last check of 1.38. Avoidance of NSAIDs. He has been working out. No anginal symptoms. He is able to exercise quite vigorously on treadmill without any difficulty. Still weightlifting.  Enjoys Jearld Shines tea.  Sleep study poor. CPAP. Unfortunately he lost his daughter, headache, meningitis.  06/29/14 - small cough. Dry. CPAP. Describes no significant chest discomfort. No significant shortness of breath. Has back pain. Denies any syncope, bleeding.    Wt Readings from Last 3 Encounters:  06/29/14 197 lb 1.9 oz (89.413 kg)  12/28/13 196 lb 6.4 oz (89.086 kg)  04/07/13 201 lb (91.173 kg)     Past Medical History  Diagnosis Date  . Fibromyalgia   . Coronary atherosclerosis of native coronary artery   . HTN (hypertension)   . Hyperlipidemia   . Erectile dysfunction   . Diabetes   . HTN (hypertension)   . Chest pain     ETT 12/09 - 10 min. no ECG changes.     Past Surgical History  Procedure Laterality Date  . Cholecystectomy      Current Outpatient Prescriptions  Medication Sig Dispense Refill  . aspirin 81 MG tablet Take 81 mg by mouth daily.    Marland Kitchen aspirin-acetaminophen-caffeine (EXCEDRIN MIGRAINE) 250-250-65 MG per tablet Take 1 tablet by mouth as needed for headache.    Marland Kitchen atorvastatin (LIPITOR) 40 MG tablet TAKE ONE  TABLET BY MOUTH ONCE DAILY. 30 tablet 3  . carvedilol (COREG) 6.25 MG tablet Take 1 tablet (6.25 mg total) by mouth 2 (two) times daily with a meal. 60 tablet 7  . cetirizine (ZYRTEC) 10 MG tablet Take 10 mg by mouth daily as needed for allergies.    Marland Kitchen gabapentin (NEURONTIN) 300 MG capsule Take 300 mg by mouth 3 (three) times daily.    Marland Kitchen glipiZIDE (GLUCOTROL) 5 MG tablet Take 5 mg by mouth daily before breakfast.    . hydrocortisone (ANUSOL-HC) 2.5 % rectal cream Place 1 application rectally 2 (two) times daily.    Marland Kitchen lisinopril-hydrochlorothiazide (PRINZIDE,ZESTORETIC) 20-25 MG per tablet Take 1 tablet by mouth daily. 30 tablet 7  . metFORMIN (GLUCOPHAGE) 500 MG tablet Take 2,000 mg by mouth daily.    . naproxen (NAPROSYN) 500 MG tablet Take 500 mg by mouth as needed.    . nitroGLYCERIN (NITROSTAT) 0.4 MG SL tablet Place 0.4 mg under the tongue every 5 (five) minutes as needed for chest pain.    . sildenafil (VIAGRA) 100 MG tablet Take 100 mg by mouth daily as needed for erectile dysfunction.    . traMADol (ULTRAM) 50 MG tablet Take 50 mg by mouth every 6 (six) hours as needed.     No current facility-administered medications for this visit.    Allergies:   No Known Allergies  Social History:  The patient  reports that he has quit  smoking. He does not have any smokeless tobacco history on file. He reports that he drinks alcohol. He reports that he does not use illicit drugs.   ROS:  Please see the history of present illness.   Denies any bleeding, syncope, orthopnea, PND  PHYSICAL EXAM: VS:  BP 130/80 mmHg  Pulse 78  Ht 5\' 8"  (1.727 m)  Wt 197 lb 1.9 oz (89.413 kg)  BMI 29.98 kg/m2 Well nourished, well developed, in no acute distress HEENT: normal Neck: no JVD Cardiac:  normal S1, S2; RRR; no murmur Lungs:  clear to auscultation bilaterally, no wheezing, rhonchi or rales Abd: soft, nontender, no hepatomegaly Ext: no edema Skin: warm and dry Neuro: no focal abnormalities  noted  EKG:  01/07/13-Sinus rhythm, T wave inversion in various leads, no change from prior    12/28/13-sinus rhythm, 74, T wave inversions inferiorly, laterally, no significant change from prior   ASSESSMENT AND PLAN:  1. OSA - CPAP. ? Dry cough 2. CAD - stable. Circumflex stent. No Angina. Doing well. EKG with no change from prior. He is not having any anginal symptoms with exercise. Handicap form filled out. Has to stop after 200 feet of walking. 3. HTN - OK with current readings. Would like for his diastolic to be less than 85.  4. DM - diet controlled. Glipizide and metformin. 7.0. 5. HL - atorvastatin 40mg . LDL 83 at last check.  6. Dry cough - will try ARB instead of ACE. Switch from lisinopril 20/25 2 losartan 100/25. 7. Six-month followup  Signed, Candee Furbish, MD [facility]  06/29/2014 8:47 AM

## 2014-06-29 NOTE — Patient Instructions (Signed)
Medication Instructions:  Please stop your Lisinopril/HCTZ and start Losartan/HCTZ 100-25 mg a day. Continue all other medications as listed.  Follow-Up: Follow up in 6 months with Dr. Marlou Porch.  You will receive a letter in the mail 2 months before you are due.  Please call us when you receive this letter to schedule your follow up appointment.  Thank you for choosing [facility]!!

## 2014-10-11 ENCOUNTER — Other Ambulatory Visit: Payer: Self-pay | Admitting: Cardiology

## 2014-12-14 ENCOUNTER — Other Ambulatory Visit: Payer: Self-pay | Admitting: Cardiology

## 2015-02-25 ENCOUNTER — Other Ambulatory Visit: Payer: Self-pay | Admitting: Cardiology

## 2015-03-18 ENCOUNTER — Other Ambulatory Visit: Payer: Self-pay | Admitting: Cardiology

## 2015-04-22 ENCOUNTER — Other Ambulatory Visit: Payer: Self-pay | Admitting: Cardiology

## 2015-05-19 ENCOUNTER — Encounter: Payer: Self-pay | Admitting: Cardiology

## 2015-05-19 ENCOUNTER — Ambulatory Visit (INDEPENDENT_AMBULATORY_CARE_PROVIDER_SITE_OTHER): Payer: BLUE CROSS/BLUE SHIELD | Admitting: Cardiology

## 2015-05-19 VITALS — BP 126/80 | HR 65 | Ht 68.0 in | Wt 195.0 lb

## 2015-05-19 DIAGNOSIS — I2510 Atherosclerotic heart disease of native coronary artery without angina pectoris: Secondary | ICD-10-CM | POA: Diagnosis not present

## 2015-05-19 DIAGNOSIS — I10 Essential (primary) hypertension: Secondary | ICD-10-CM | POA: Diagnosis not present

## 2015-05-19 DIAGNOSIS — E785 Hyperlipidemia, unspecified: Secondary | ICD-10-CM | POA: Diagnosis not present

## 2015-05-19 DIAGNOSIS — E119 Type 2 diabetes mellitus without complications: Secondary | ICD-10-CM

## 2015-05-19 MED ORDER — LOSARTAN POTASSIUM-HCTZ 100-25 MG PO TABS: 1.0000 | ORAL_TABLET | Freq: Every day | ORAL | Status: DC

## 2015-05-19 MED ORDER — ATORVASTATIN CALCIUM 40 MG PO TABS: 40.0000 mg | ORAL_TABLET | Freq: Every day | ORAL | Status: DC

## 2015-05-19 NOTE — Progress Notes (Signed)
[place]. [address] [place], [place]  [address] Phone: [phone] Fax:  [phone]  Date:  05/19/2015   ID:  Clarence Jones, DOB 1960-08-13, MRN [id number]  PCP:  Doristine Locks, NP   History of Present Illness: Clarence Jones is a 55 y.o. male with coronary artery disease, proximal stent in the circumflex artery placed in September 2009, diabetes, hypertension, hyperlipidemia here for followup.   He's not having any exertional anginal symptoms, doing well. He is compliant with his medications. He is quite active, lifts weights. His hemoglobin A1c is much better from 8.4 to 6.8.   We previously discussed his creatinine last check of 1.38. Avoidance of NSAIDs.   Enjoys Jearld Shines tea.  Sleep study poor. CPAP. Unfortunately he lost his daughter, headache, meningitis.  No chest pain, no shortness of breath, no syncope, no bleeding. With addition of low-dose amlodipine, he is doing quite well.    Wt Readings from Last 3 Encounters:  05/19/15 195 lb (88.451 kg)  06/29/14 197 lb 1.9 oz (89.413 kg)  12/28/13 196 lb 6.4 oz (89.086 kg)     Past Medical History  Diagnosis Date  . Fibromyalgia   . Coronary atherosclerosis of native coronary artery   . HTN (hypertension)   . Hyperlipidemia   . Erectile dysfunction   . Diabetes ([place])   . HTN (hypertension)   . Chest pain     ETT 12/09 - 10 min. no ECG changes.     Past Surgical History  Procedure Laterality Date  . Cholecystectomy      Current Outpatient Prescriptions  Medication Sig Dispense Refill  . amLODipine (NORVASC) 2.5 MG tablet Take 2.5 mg by mouth daily.    Marland Kitchen aspirin 81 MG tablet Take 81 mg by mouth daily.    Marland Kitchen aspirin-acetaminophen-caffeine (EXCEDRIN MIGRAINE) 250-250-65 MG per tablet Take 1 tablet by mouth as needed for headache.    Marland Kitchen atorvastatin (LIPITOR) 40 MG tablet Take 1 tablet (40 mg total) by mouth daily. 90 tablet 3  . carvedilol (COREG) 6.25 MG tablet Take 1 tablet (6.25 mg total)  by mouth 2 (two) times daily with a meal. 60 tablet 7  . cetirizine (ZYRTEC) 10 MG tablet Take 10 mg by mouth daily as needed for allergies.    Marland Kitchen gabapentin (NEURONTIN) 300 MG capsule Take 300 mg by mouth 3 (three) times daily.    Marland Kitchen glipiZIDE (GLUCOTROL) 5 MG tablet Take 5 mg by mouth daily before breakfast.    . hydrocortisone (ANUSOL-HC) 2.5 % rectal cream Place 1 application rectally 2 (two) times daily.    Marland Kitchen losartan-hydrochlorothiazide (HYZAAR) 100-25 MG tablet Take 1 tablet by mouth daily. 90 tablet 3  . metFORMIN (GLUCOPHAGE) 500 MG tablet Take 2,000 mg by mouth daily.    . naproxen (NAPROSYN) 500 MG tablet Take 500 mg by mouth as needed.    . nitroGLYCERIN (NITROSTAT) 0.4 MG SL tablet Place 0.4 mg under the tongue every 5 (five) minutes as needed for chest pain.    . sildenafil (VIAGRA) 100 MG tablet Take 100 mg by mouth daily as needed for erectile dysfunction.    . traMADol (ULTRAM) 50 MG tablet Take 50 mg by mouth every 6 (six) hours as needed.     No current facility-administered medications for this visit.    Allergies:   No Known Allergies  Social History:  The patient  reports that he has quit smoking. He does not have any smokeless tobacco history  on file. He reports that he drinks alcohol. He reports that he does not use illicit drugs.   ROS:  Please see the history of present illness.   Denies any bleeding, syncope, orthopnea, PND  PHYSICAL EXAM: VS:  BP 126/80 mmHg  Pulse 65  Ht 5\' 8"  (1.727 m)  Wt 195 lb (88.451 kg)  BMI 29.66 kg/m2 Well nourished, well developed, in no acute distress HEENT: normal Neck: no JVD Cardiac:  normal S1, S2; RRR; no murmur Lungs:  clear to auscultation bilaterally, no wheezing, rhonchi or rales Abd: soft, nontender, no hepatomegaly Ext: no edema Skin: warm and dry Neuro: no focal abnormalities noted  EKG:  EKG was ordered today. 05/19/15 normal rhythm 65 bpm, T-wave inversion inferiorly as well as laterally beginning in V3 through V6  personally viewed-no change from prior. 01/07/13-Sinus rhythm, T wave inversion in various leads, no change from prior    12/28/13-sinus rhythm, 74, T wave inversions inferiorly, laterally, no significant change from prior   ASSESSMENT AND PLAN:  1. OSA - CPAP. 2. CAD - stable. Circumflex stent. No Angina. Doing well. EKG with no change from prior. He is not having any anginal symptoms with exercise 3. HTN - excellent control, medications reviewed.  4. DM -  Glipizide and metformin.6.8 5. HL - atorvastatin 40mg . LDL 83 at last check.  6. Dry cough - ARB instead of ACE. Switch from lisinopril 20/25 2 losartan 100/25. Doing well, no further cough. 7. Six-month followup  Signed, Candee Furbish, MD [facility]  05/19/2015 10:37 AM

## 2015-05-19 NOTE — Patient Instructions (Signed)
Medication Instructions:  Your physician recommends that you continue on your current medications as directed. Please refer to the Current Medication list given to you today.  Labwork: None ordered  Testing/Procedures: None odered  Follow-Up: Your physician wants you to follow-up in: 6 months with Dr.Skains You will receive a reminder letter in the mail two months in advance. If you don't receive a letter, please call our office to schedule the follow-up appointment.   Any Other Special Instructions Will Be Listed Below (If Applicable).     If you need a refill on your cardiac medications before your next appointment, please call your pharmacy.

## 2015-07-20 ENCOUNTER — Other Ambulatory Visit: Payer: Self-pay | Admitting: Cardiology

## 2015-11-29 ENCOUNTER — Ambulatory Visit (INDEPENDENT_AMBULATORY_CARE_PROVIDER_SITE_OTHER): Payer: BLUE CROSS/BLUE SHIELD | Admitting: Cardiology

## 2015-11-29 ENCOUNTER — Encounter (INDEPENDENT_AMBULATORY_CARE_PROVIDER_SITE_OTHER): Payer: Self-pay

## 2015-11-29 ENCOUNTER — Encounter: Payer: Self-pay | Admitting: Cardiology

## 2015-11-29 VITALS — BP 126/78 | HR 82 | Ht 68.0 in | Wt 195.8 lb

## 2015-11-29 DIAGNOSIS — I2510 Atherosclerotic heart disease of native coronary artery without angina pectoris: Secondary | ICD-10-CM

## 2015-11-29 DIAGNOSIS — I10 Essential (primary) hypertension: Secondary | ICD-10-CM

## 2015-11-29 DIAGNOSIS — E7800 Pure hypercholesterolemia, unspecified: Secondary | ICD-10-CM

## 2015-11-29 NOTE — Patient Instructions (Signed)
Medication Instructions:  Your physician recommends that you continue on your current medications as directed. Please refer to the Current Medication list given to you today.   Labwork: none  Testing/Procedures: none  Follow-Up: Your physician wants you to follow-up in: 6 months with Dr Marlou Porch. (April 2018).  You will receive a reminder letter in the mail two months in advance. If you don't receive a letter, please call our office to schedule the follow-up appointment.        If you need a refill on your cardiac medications before your next appointment, please call your pharmacy.

## 2015-11-29 NOTE — Progress Notes (Signed)
[place]. [address], [address] [place], [place]  [address] Phone: [phone] Fax:  [phone]  Date:  11/29/2015   ID:  Clarence Jones, DOB 1960/06/27, MRN [id number]  PCP:  Doristine Locks, NP   History of Present Illness: Clarence Jones is a 55 y.o. male with coronary artery disease, proximal stent in the circumflex artery placed in September,13 2009, diabetes, hypertension, hyperlipidemia here for followup.   He's not having any exertional anginal symptoms, doing well. He is compliant with his medications. He is quite active, lifts weights. His hemoglobin A1c is much better from 8.4 to 6.3. Stairstepper for 60 minutes. extremely heavy weights.  We previously discussed his creatinine last check of 1.38. Avoidance of NSAIDs.   Enjoys smoothies  Sleep study poor. CPAP. Unfortunately he lost his daughter, headache, meningitis.  No chest pain, no shortness of breath, no syncope, no bleeding. With addition of low-dose amlodipine, he is doing quite well.    Wt Readings from Last 3 Encounters:  11/29/15 195 lb 12.8 oz (88.8 kg)  05/19/15 195 lb (88.5 kg)  06/29/14 197 lb 1.9 oz (89.4 kg)     Past Medical History:  Diagnosis Date  . Chest pain    ETT 12/09 - 10 min. no ECG changes.   . Coronary atherosclerosis of native coronary artery   . Diabetes ([place])   . Erectile dysfunction   . Fibromyalgia   . HTN (hypertension)   . HTN (hypertension)   . Hyperlipidemia     Past Surgical History:  Procedure Laterality Date  . CHOLECYSTECTOMY      Current Outpatient Prescriptions  Medication Sig Dispense Refill  . amLODipine (NORVASC) 2.5 MG tablet Take 2.5 mg by mouth daily.    Marland Kitchen aspirin 81 MG tablet Take 81 mg by mouth daily.    Marland Kitchen aspirin-acetaminophen-caffeine (EXCEDRIN MIGRAINE) 250-250-65 MG per tablet Take 1 tablet by mouth as needed for headache.    Marland Kitchen atorvastatin (LIPITOR) 40 MG tablet Take 1 tablet (40 mg total) by mouth daily. 30 tablet 0  . carvedilol  (COREG) 6.25 MG tablet Take 1 tablet (6.25 mg total) by mouth 2 (two) times daily with a meal. 60 tablet 7  . cetirizine (ZYRTEC) 10 MG tablet Take 10 mg by mouth daily as needed for allergies.    Marland Kitchen gabapentin (NEURONTIN) 300 MG capsule Take 300 mg by mouth 3 (three) times daily.    Marland Kitchen glipiZIDE (GLUCOTROL) 5 MG tablet Take 5 mg by mouth daily before breakfast.    . hydrocortisone (ANUSOL-HC) 2.5 % rectal cream Place 1 application rectally 2 (two) times daily.    Marland Kitchen losartan-hydrochlorothiazide (HYZAAR) 100-25 MG tablet Take 1 tablet by mouth daily. 90 tablet 3  . metFORMIN (GLUCOPHAGE) 500 MG tablet Take 2,000 mg by mouth daily.    . naproxen (NAPROSYN) 500 MG tablet Take 500 mg by mouth as needed.    . nitroGLYCERIN (NITROSTAT) 0.4 MG SL tablet Place 0.4 mg under the tongue every 5 (five) minutes as needed for chest pain.    . sildenafil (VIAGRA) 100 MG tablet Take 100 mg by mouth daily as needed for erectile dysfunction.    . traMADol (ULTRAM) 50 MG tablet Take 50 mg by mouth every 6 (six) hours as needed.     No current facility-administered medications for this visit.     Allergies:   No Known Allergies  Social History:  The patient  reports that he has quit smoking. He does not have  any smokeless tobacco history on file. He reports that he drinks alcohol. He reports that he does not use drugs.   ROS:  Please see the history of present illness.   Denies any bleeding, syncope, orthopnea, PND  PHYSICAL EXAM: VS:  BP 126/78   Pulse 82   Ht 5\' 8"  (1.727 m)   Wt 195 lb 12.8 oz (88.8 kg)   BMI 29.77 kg/m  Well nourished, well developed, in no acute distress  HEENT: normal  Neck: no JVD  Cardiac:  normal S1, S2; RRR; no murmur  Lungs:  clear to auscultation bilaterally, no wheezing, rhonchi or rales  Abd: soft, nontender, no hepatomegaly  Ext: no edema  Skin: warm and dry  Neuro: no focal abnormalities noted  EKG:  EKG  05/19/15 normal rhythm 65 bpm, T-wave inversion inferiorly as  well as laterally beginning in V3 through V6 personally viewed-no change from prior. 01/07/13-Sinus rhythm, T wave inversion in various leads, no change from prior    12/28/13-sinus rhythm, 74, T wave inversions inferiorly, laterally, no significant change from prior   ASSESSMENT AND PLAN:  1. OSA - CPAP. 2. CAD - stable. Circumflex stent. No Angina. 2009. Doing well. EKG with no change from prior. He is not having any anginal symptoms with exercise 3. HTN - excellent control, medications reviewed.  4. DM -  Glipizide and metformin.6.3 improved 5. HL - atorvastatin 40mg . LDL 83 at last check.  6. Dry cough - ARB instead of ACE. Switch from lisinopril 20/25 2 losartan 100/25. Doing well, no further cough. Still working out quite vigorously. Doing well with no anginal symptoms. 7. Six-month followup  Signed, Candee Furbish, MD [facility]  11/29/2015 8:54 AM

## 2016-06-01 ENCOUNTER — Encounter: Payer: Self-pay | Admitting: Cardiology

## 2016-06-01 ENCOUNTER — Ambulatory Visit (INDEPENDENT_AMBULATORY_CARE_PROVIDER_SITE_OTHER): Payer: BLUE CROSS/BLUE SHIELD | Admitting: Cardiology

## 2016-06-01 ENCOUNTER — Encounter (INDEPENDENT_AMBULATORY_CARE_PROVIDER_SITE_OTHER): Payer: Self-pay

## 2016-06-01 VITALS — BP 128/82 | HR 62 | Ht 68.0 in | Wt 192.2 lb

## 2016-06-01 DIAGNOSIS — I10 Essential (primary) hypertension: Secondary | ICD-10-CM | POA: Diagnosis not present

## 2016-06-01 DIAGNOSIS — E7800 Pure hypercholesterolemia, unspecified: Secondary | ICD-10-CM

## 2016-06-01 DIAGNOSIS — I2510 Atherosclerotic heart disease of native coronary artery without angina pectoris: Secondary | ICD-10-CM | POA: Diagnosis not present

## 2016-06-01 MED ORDER — LOSARTAN POTASSIUM-HCTZ 100-25 MG PO TABS: 1.0000 | ORAL_TABLET | Freq: Every day | ORAL | 11 refills | Status: DC

## 2016-06-01 MED ORDER — AMLODIPINE BESYLATE 2.5 MG PO TABS: 2.5000 mg | ORAL_TABLET | Freq: Every day | ORAL | 11 refills | Status: DC

## 2016-06-01 MED ORDER — CARVEDILOL 6.25 MG PO TABS: 6.2500 mg | ORAL_TABLET | Freq: Two times a day (BID) | ORAL | 11 refills | Status: AC

## 2016-06-01 MED ORDER — ATORVASTATIN CALCIUM 40 MG PO TABS: 40.0000 mg | ORAL_TABLET | Freq: Every day | ORAL | 11 refills | Status: DC

## 2016-06-01 NOTE — Progress Notes (Signed)
[place]. [address], [address] [place], [place]  [address] Phone: [phone] Fax:  [phone]  Date:  06/01/2016   ID:  Clarence Jones, DOB [birth year], MRN [id number]  PCP:  Doristine Locks, NP   History of Present Illness: Clarence Jones is a 56 y.o. male with coronary artery disease, proximal stent in the circumflex artery placed in September,13 2009, diabetes, hypertension, hyperlipidemia here for followup.   He's not having any exertional anginal symptoms, doing well. He is compliant with his medications. He is quite active, lifts weights. His hemoglobin A1c is much better from 8.4 to 6.3. Stairstepper for 60 minutes. He's not lifting is much weight vigorously as he used to. He stopped after he bench pressed 500 pounds.  We previously discussed his creatinine last check of 1.38. Avoidance of NSAIDs.   Sleep study poor. CPAP.   Unfortunately he lost his daughter, headache, meningitis.  Overall feeling quite well, no syncope, no orthopnea, no chest pain, no shortness of breath. He still is working on his diabetes but somewhat frustrated at times with the goal A1c.  Huge [employer].   Wt Readings from Last 3 Encounters:  06/01/16 192 lb 3.2 oz (87.2 kg)  11/29/15 195 lb 12.8 oz (88.8 kg)  05/19/15 195 lb (88.5 kg)     Past Medical History:  Diagnosis Date  . Chest pain    ETT 12/09 - 10 min. no ECG changes.   . Coronary atherosclerosis of native coronary artery   . Diabetes ([place])   . Erectile dysfunction   . Fibromyalgia   . HTN (hypertension)   . HTN (hypertension)   . Hyperlipidemia     Past Surgical History:  Procedure Laterality Date  . CHOLECYSTECTOMY      Current Outpatient Prescriptions  Medication Sig Dispense Refill  . amLODipine (NORVASC) 2.5 MG tablet Take 1 tablet (2.5 mg total) by mouth daily. 30 tablet 11  . aspirin 81 MG tablet Take 81 mg by mouth daily.    Marland Kitchen aspirin-acetaminophen-caffeine (EXCEDRIN MIGRAINE) 250-250-65 MG  per tablet Take 1 tablet by mouth as needed for headache (Take as directed).     Marland Kitchen atorvastatin (LIPITOR) 40 MG tablet Take 1 tablet (40 mg total) by mouth daily. 30 tablet 11  . carvedilol (COREG) 6.25 MG tablet Take 1 tablet (6.25 mg total) by mouth 2 (two) times daily with a meal. 60 tablet 11  . cetirizine (ZYRTEC) 10 MG tablet Take 10 mg by mouth daily as needed for allergies.    Marland Kitchen gabapentin (NEURONTIN) 300 MG capsule Take 300 mg by mouth 3 (three) times daily.    Marland Kitchen glipiZIDE (GLUCOTROL) 5 MG tablet Take 5 mg by mouth daily before breakfast.    . hydrocortisone (ANUSOL-HC) 2.5 % rectal cream Place 1 application rectally 2 (two) times daily.    Marland Kitchen losartan-hydrochlorothiazide (HYZAAR) 100-25 MG tablet Take 1 tablet by mouth daily. 30 tablet 11  . metFORMIN (GLUCOPHAGE) 500 MG tablet Take 2,000 mg by mouth daily.    . naproxen (NAPROSYN) 500 MG tablet Take 500 mg by mouth as needed (Take as directed for pain).     . nitroGLYCERIN (NITROSTAT) 0.4 MG SL tablet Place 0.4 mg under the tongue every 5 (five) minutes as needed for chest pain (MAX 3 TABLETS).     . sildenafil (VIAGRA) 100 MG tablet Take 100 mg by mouth daily as needed for erectile dysfunction.    . traMADol (ULTRAM) 50 MG tablet Take 50  mg by mouth every 6 (six) hours as needed (pain).      No current facility-administered medications for this visit.     Allergies:   No Known Allergies  Social History:  The patient  reports that he quit smoking about 13 years ago. He has never used smokeless tobacco. He reports that he drinks alcohol. He reports that he does not use drugs.   ROS:  Please see the history of present illness.   Unless specified above all other review of systems negative.  PHYSICAL EXAM: VS:  BP 128/82   Pulse 62   Ht 5\' 8"  (1.727 m)   Wt 192 lb 3.2 oz (87.2 kg)   SpO2 97%   BMI 29.22 kg/m  GEN: Well nourished, well developed, in no acute distress  HEENT: normal  Neck: no JVD, carotid bruits, or  masses Cardiac: RRR; no murmurs, rubs, or gallops,no edema  Respiratory:  clear to auscultation bilaterally, normal work of breathing GI: soft, nontender, nondistended, + BS MS: no deformity or atrophy  Skin: warm and dry, no rash Neuro:  Alert and Oriented x 3, Strength and sensation are intact Psych: euthymic mood, full affect   EKG:  EKG  Done today 06/01/16-sinus rhythm 62 with T-wave inversion no change from prior, inferior as well as lateral limb leads. 05/19/15 normal rhythm 65 bpm, T-wave inversion inferiorly as well as laterally beginning in V3 through V6 personally viewed-no change from prior. 01/07/13-Sinus rhythm, T wave inversion in various leads, no change from prior    12/28/13-sinus rhythm, 74, T wave inversions inferiorly, laterally, no significant change from prior   ASSESSMENT AND PLAN:  Coronary artery disease  - Overall doing very well without any anginal symptoms. Prior circumflex stent in 2009.  Essential hypertension  - Good control. No changes made.  Diabetes with hypertension  - He works closely with Dr. Raliegh Ip. Continue to promote decreasing A1c.  Obstructive sleep apnea  - CPAP  Hyperlipidemia  - Atorvastatin. High intensity dose. Doing well. Dr. Raliegh Ip has been watching labs.  Six-month follow-up  Signed, Candee Furbish, MD [facility]  06/01/2016 8:57 AM

## 2016-06-01 NOTE — Patient Instructions (Signed)
Medication Instructions:  Your physician recommends that you continue on your current medications as directed. Please refer to the Current Medication list given to you today.  Labwork: None  Testing/Procedures: None  Follow-Up: Your physician wants you to follow-up in: 6 months with Dr. Skains.  You will receive a reminder letter in the mail two months in advance. If you don't receive a letter, please call our office to schedule the follow-up appointment.   Any Other Special Instructions Will Be Listed Below (If Applicable).     If you need a refill on your cardiac medications before your next appointment, please call your pharmacy.   

## 2017-02-04 NOTE — Progress Notes (Signed)
[name]. [address] [place], [place]  [address] Phone: [phone] Fax:  [phone]  Date:  02/05/2017   ID:  Clarence Jones, DOB 1960/10/22, MRN [id number]  PCP:  Doristine Locks, NP   History of Present Illness: Clarence Jones is a 57 y.o. male with coronary artery disease, proximal stent in the circumflex artery placed in September,13 2009, diabetes, hypertension, hyperlipidemia here for followup.   He remains compliant with his medications. He is quite active, lifts weights. His hemoglobin A1c is much better from 8.4 to 6.3. Stairstepper for 60 minutes. He's not lifting is much weight vigorously as he used to. He stopped after he bench pressed 500 pounds.  We previously discussed his creatinine last check of 1.38. Avoidance of NSAIDs.   Sleep study poor. CPAP.   Unfortunately he lost his daughter, headache, meningitis.  Huge [employer].  02/05/17-overall he is doing quite well.  No new complaints.  No chest pain syncope bleeding orthopnea PND.  He is medication compliant.   Wt Readings from Last 3 Encounters:  02/05/17 191 lb 12.8 oz (87 kg)  06/01/16 192 lb 3.2 oz (87.2 kg)  11/29/15 195 lb 12.8 oz (88.8 kg)     Past Medical History:  Diagnosis Date  . Chest pain    ETT 12/09 - 10 min. no ECG changes.   . Coronary atherosclerosis of native coronary artery   . Diabetes ([place])   . Erectile dysfunction   . Fibromyalgia   . HTN (hypertension)   . HTN (hypertension)   . Hyperlipidemia     Past Surgical History:  Procedure Laterality Date  . CHOLECYSTECTOMY      Current Outpatient Medications  Medication Sig Dispense Refill  . amLODipine (NORVASC) 2.5 MG tablet Take 1 tablet (2.5 mg total) by mouth daily. 30 tablet 11  . aspirin 81 MG tablet Take 81 mg by mouth daily.    Marland Kitchen aspirin-acetaminophen-caffeine (EXCEDRIN MIGRAINE) 250-250-65 MG per tablet Take 1 tablet by mouth as directed.     Marland Kitchen atorvastatin (LIPITOR) 40 MG tablet Take 1  tablet (40 mg total) by mouth daily. 30 tablet 11  . carvedilol (COREG) 6.25 MG tablet Take 1 tablet (6.25 mg total) by mouth 2 (two) times daily with a meal. 60 tablet 11  . cetirizine (ZYRTEC) 10 MG tablet Take 10 mg by mouth daily as needed for allergies.    Marland Kitchen gabapentin (NEURONTIN) 300 MG capsule Take 300 mg by mouth 3 (three) times daily.    Marland Kitchen glipiZIDE (GLUCOTROL) 5 MG tablet Take 5 mg by mouth daily before breakfast.    . HYDROCODONE-ACETAMINOPHEN PO Take 1 tablet by mouth 2 (two) times daily as needed (back pain. (Pt unsure of tablet strength)).    . hydrocortisone (ANUSOL-HC) 2.5 % rectal cream Place 1 application rectally 2 (two) times daily.    Marland Kitchen losartan-hydrochlorothiazide (HYZAAR) 100-25 MG tablet Take 1 tablet by mouth daily. 30 tablet 11  . metFORMIN (GLUCOPHAGE) 500 MG tablet Take 2,000 mg by mouth daily.    . naproxen (NAPROSYN) 500 MG tablet Take 500 mg by mouth as directed.     . sildenafil (VIAGRA) 100 MG tablet Take 100 mg by mouth daily as needed for erectile dysfunction.    . nitroGLYCERIN (NITROSTAT) 0.4 MG SL tablet Place 0.4 mg under the tongue every 5 (five) minutes as needed for chest pain (MAX 3 TABLETS).      No current facility-administered medications for this visit.  Allergies:   No Known Allergies  Social History:  The patient  reports that he quit smoking about 14 years ago. he has never used smokeless tobacco. He reports that he drinks alcohol. He reports that he does not use drugs.   ROS:  Please see the history of present illness.   Unless stated above all other review of systems negative  PHYSICAL EXAM: VS:  BP (!) 144/70 (BP Location: Left Arm)   Pulse 77   Ht 5\' 8"  (1.727 m)   Wt 191 lb 12.8 oz (87 kg)   BMI 29.16 kg/m  GEN: Well nourished, well developed, in no acute distress  HEENT: normal  Neck: no JVD, carotid bruits, or masses Cardiac: RRR; no murmurs, rubs, or gallops,no edema  Respiratory:  clear to auscultation bilaterally, normal  work of breathing GI: soft, nontender, nondistended, + BS MS: no deformity or atrophy  Skin: warm and dry, no rash Neuro:  Alert and Oriented x 3, Strength and sensation are intact Psych: euthymic mood, full affect    EKG:  EKG 06/01/16-sinus rhythm 62 with T-wave inversion no change from prior, inferior as well as lateral limb leads. 05/19/15 normal rhythm 65 bpm, T-wave inversion inferiorly as well as laterally beginning in V3 through V6 personally viewed-no change from prior. 01/07/13-Sinus rhythm, T wave inversion in various leads, no change from prior    12/28/13-sinus rhythm, 74, T wave inversions inferiorly, laterally, no significant change from prior   ASSESSMENT AND PLAN:  Coronary artery disease  - Prior circumflex stent in 2009.  No anginal symptoms.  Continue with aggressive secondary risk factor prevention including close control of his diabetes.  He is on beta-blocker, statin, aspirin.  That  Essential hypertension  - Good control normally, mildly elevated now. No changes made.  Medications reviewed.  Diabetes with hypertension  - He works closely with Dr. Raliegh Ip. Continue to promote decreasing A1c.  No changes.  Obstructive sleep apnea  - CPAP, still utilizes  Hyperlipidemia  - Atorvastatin. High intensity dose. Doing well. Dr. Raliegh Ip has been watching labs.  No changes made.  Six-month follow-up  Signed, Candee Furbish, MD [facility]  02/05/2017 9:07 AM

## 2017-02-05 ENCOUNTER — Ambulatory Visit (INDEPENDENT_AMBULATORY_CARE_PROVIDER_SITE_OTHER): Payer: BLUE CROSS/BLUE SHIELD | Admitting: Cardiology

## 2017-02-05 ENCOUNTER — Encounter: Payer: Self-pay | Admitting: Cardiology

## 2017-02-05 VITALS — BP 144/70 | HR 77 | Ht 68.0 in | Wt 191.8 lb

## 2017-02-05 DIAGNOSIS — I10 Essential (primary) hypertension: Secondary | ICD-10-CM | POA: Diagnosis not present

## 2017-02-05 DIAGNOSIS — E119 Type 2 diabetes mellitus without complications: Secondary | ICD-10-CM

## 2017-02-05 DIAGNOSIS — E7800 Pure hypercholesterolemia, unspecified: Secondary | ICD-10-CM

## 2017-02-05 DIAGNOSIS — I2510 Atherosclerotic heart disease of native coronary artery without angina pectoris: Secondary | ICD-10-CM | POA: Diagnosis not present

## 2017-02-05 MED ORDER — NITROGLYCERIN 0.4 MG SL SUBL: 0.4000 mg | SUBLINGUAL_TABLET | SUBLINGUAL | 3 refills | Status: DC | PRN

## 2017-02-05 NOTE — Patient Instructions (Signed)

## 2017-02-08 DIAGNOSIS — Z0000 Encounter for general adult medical examination without abnormal findings: Secondary | ICD-10-CM | POA: Diagnosis not present

## 2017-02-08 DIAGNOSIS — E1140 Type 2 diabetes mellitus with diabetic neuropathy, unspecified: Secondary | ICD-10-CM | POA: Diagnosis not present

## 2017-02-08 DIAGNOSIS — Z7984 Long term (current) use of oral hypoglycemic drugs: Secondary | ICD-10-CM | POA: Diagnosis not present

## 2017-02-26 ENCOUNTER — Telehealth: Payer: Self-pay | Admitting: *Deleted

## 2017-02-26 MED ORDER — HYDROCHLOROTHIAZIDE 25 MG PO TABS: 25.0000 mg | ORAL_TABLET | Freq: Every day | ORAL | 5 refills | Status: DC

## 2017-02-26 MED ORDER — LOSARTAN POTASSIUM 100 MG PO TABS: 100.0000 mg | ORAL_TABLET | Freq: Every day | ORAL | 5 refills | Status: DC

## 2017-02-26 NOTE — Telephone Encounter (Signed)
Received notice from [facility] is on back order.  Per DrSkains - OK to fill with Losartan 100 and HCTZ 25 mg a day.  Will send rx in electronically.

## 2017-04-29 DIAGNOSIS — Z79899 Other long term (current) drug therapy: Secondary | ICD-10-CM | POA: Diagnosis not present

## 2017-04-29 DIAGNOSIS — E1140 Type 2 diabetes mellitus with diabetic neuropathy, unspecified: Secondary | ICD-10-CM | POA: Diagnosis not present

## 2017-04-29 DIAGNOSIS — Z7984 Long term (current) use of oral hypoglycemic drugs: Secondary | ICD-10-CM | POA: Diagnosis not present

## 2017-04-29 DIAGNOSIS — I10 Essential (primary) hypertension: Secondary | ICD-10-CM | POA: Diagnosis not present

## 2017-04-29 DIAGNOSIS — I2510 Atherosclerotic heart disease of native coronary artery without angina pectoris: Secondary | ICD-10-CM | POA: Diagnosis not present

## 2017-05-10 DIAGNOSIS — Z135 Encounter for screening for eye and ear disorders: Secondary | ICD-10-CM | POA: Diagnosis not present

## 2017-05-10 DIAGNOSIS — E119 Type 2 diabetes mellitus without complications: Secondary | ICD-10-CM | POA: Diagnosis not present

## 2017-05-13 DIAGNOSIS — Z135 Encounter for screening for eye and ear disorders: Secondary | ICD-10-CM | POA: Diagnosis not present

## 2017-05-13 DIAGNOSIS — E119 Type 2 diabetes mellitus without complications: Secondary | ICD-10-CM | POA: Diagnosis not present

## 2017-06-07 DIAGNOSIS — Z125 Encounter for screening for malignant neoplasm of prostate: Secondary | ICD-10-CM | POA: Diagnosis not present

## 2017-06-07 DIAGNOSIS — I2510 Atherosclerotic heart disease of native coronary artery without angina pectoris: Secondary | ICD-10-CM | POA: Diagnosis not present

## 2017-06-07 DIAGNOSIS — E119 Type 2 diabetes mellitus without complications: Secondary | ICD-10-CM | POA: Diagnosis not present

## 2017-06-25 ENCOUNTER — Other Ambulatory Visit: Payer: Self-pay | Admitting: Cardiology

## 2017-07-29 DIAGNOSIS — I10 Essential (primary) hypertension: Secondary | ICD-10-CM | POA: Diagnosis not present

## 2017-07-29 DIAGNOSIS — E7800 Pure hypercholesterolemia, unspecified: Secondary | ICD-10-CM | POA: Diagnosis not present

## 2017-07-29 DIAGNOSIS — E1140 Type 2 diabetes mellitus with diabetic neuropathy, unspecified: Secondary | ICD-10-CM | POA: Diagnosis not present

## 2017-08-12 ENCOUNTER — Ambulatory Visit (INDEPENDENT_AMBULATORY_CARE_PROVIDER_SITE_OTHER): Payer: Commercial Managed Care - PPO | Admitting: Cardiology

## 2017-08-12 ENCOUNTER — Encounter: Payer: Self-pay | Admitting: Cardiology

## 2017-08-12 VITALS — BP 144/90 | HR 76 | Ht 68.0 in | Wt 194.4 lb

## 2017-08-12 DIAGNOSIS — E119 Type 2 diabetes mellitus without complications: Secondary | ICD-10-CM | POA: Diagnosis not present

## 2017-08-12 DIAGNOSIS — I10 Essential (primary) hypertension: Secondary | ICD-10-CM | POA: Diagnosis not present

## 2017-08-12 DIAGNOSIS — E7800 Pure hypercholesterolemia, unspecified: Secondary | ICD-10-CM | POA: Diagnosis not present

## 2017-08-12 DIAGNOSIS — I2510 Atherosclerotic heart disease of native coronary artery without angina pectoris: Secondary | ICD-10-CM

## 2017-08-12 MED ORDER — LOSARTAN POTASSIUM 100 MG PO TABS: 100.0000 mg | ORAL_TABLET | Freq: Every day | ORAL | 3 refills | Status: DC

## 2017-08-12 MED ORDER — ATORVASTATIN CALCIUM 40 MG PO TABS: 40.0000 mg | ORAL_TABLET | Freq: Every day | ORAL | 3 refills | Status: DC

## 2017-08-12 NOTE — Progress Notes (Signed)
[facility] [place]. [address] [place], [place]  [address] Phone: [phone] Fax:  [phone]  Date:  08/12/2017   ID:  Jef Futch, DOB Dec 17, 1960, MRN [id number]  PCP:  Doristine Locks, NP   History of Present Illness: Caton Popowski is a 57 y.o. male with coronary artery disease, proximal stent in the circumflex artery placed in September,13 2009, diabetes, hypertension, hyperlipidemia here for followup.   He remains compliant with his medications. He is quite active, lifts weights. His hemoglobin A1c is much better from 8.4 to 6.3. Stairstepper for 60 minutes. He's not lifting is much weight vigorously as he used to. He stopped after he bench pressed 500 pounds.  We previously discussed his creatinine last check of 1.38. Avoidance of NSAIDs.   Sleep study poor. CPAP.   Unfortunately he lost his daughter, headache, meningitis.  Huge [employer].  02/05/17-overall he is doing quite well.  No new complaints.  No chest pain syncope bleeding orthopnea PND.  He is medication compliant.  08/12/2017- still continues to do well.  Working out quite vigorously.  Pushed 550 pounds.  No chest pain fevers chills nausea vomiting syncope.  I asked about stress levels.  Wt Readings from Last 3 Encounters:  08/12/17 194 lb 6.4 oz (88.2 kg)  02/05/17 191 lb 12.8 oz (87 kg)  06/01/16 192 lb 3.2 oz (87.2 kg)     Past Medical History:  Diagnosis Date  . Chest pain    ETT 12/09 - 10 min. no ECG changes.   . Coronary atherosclerosis of native coronary artery   . Diabetes ([place])   . Erectile dysfunction   . Fibromyalgia   . HTN (hypertension)   . HTN (hypertension)   . Hyperlipidemia     Past Surgical History:  Procedure Laterality Date  . CHOLECYSTECTOMY      Current Outpatient Medications  Medication Sig Dispense Refill  . amLODipine (NORVASC) 2.5 MG tablet TAKE 1 TABLET BY MOUTH ONCE DAILY 90 tablet 1  . aspirin 81 MG tablet Take 81 mg by mouth daily.    Marland Kitchen  aspirin-acetaminophen-caffeine (EXCEDRIN MIGRAINE) 250-250-65 MG per tablet Take 1 tablet by mouth as directed.     Marland Kitchen atorvastatin (LIPITOR) 40 MG tablet Take 1 tablet (40 mg total) by mouth daily. 90 tablet 3  . carvedilol (COREG) 6.25 MG tablet Take 1 tablet (6.25 mg total) by mouth 2 (two) times daily with a meal. 60 tablet 11  . cetirizine (ZYRTEC) 10 MG tablet Take 10 mg by mouth daily as needed for allergies.    Marland Kitchen gabapentin (NEURONTIN) 300 MG capsule Take 300 mg by mouth 3 (three) times daily.    Marland Kitchen glipiZIDE (GLUCOTROL) 5 MG tablet Take 5 mg by mouth daily before breakfast.    . hydrochlorothiazide (HYDRODIURIL) 25 MG tablet Take 1 tablet (25 mg total) by mouth daily. 30 tablet 5  . HYDROCODONE-ACETAMINOPHEN PO Take 1 tablet by mouth 2 (two) times daily as needed (back pain. (Pt unsure of tablet strength)).    . hydrocortisone (ANUSOL-HC) 2.5 % rectal cream Place 1 application rectally 2 (two) times daily.    Marland Kitchen losartan (COZAAR) 100 MG tablet Take 1 tablet (100 mg total) by mouth daily. 90 tablet 3  . metFORMIN (GLUCOPHAGE) 500 MG tablet Take 2,000 mg by mouth daily.    . naproxen (NAPROSYN) 500 MG tablet Take 500 mg by mouth as directed.     . nitroGLYCERIN (NITROSTAT) 0.4 MG SL tablet  Place 1 tablet (0.4 mg total) under the tongue every 5 (five) minutes as needed for chest pain (MAX 3 TABLETS). 25 tablet 3   No current facility-administered medications for this visit.     Allergies:   No Known Allergies  Social History:  The patient  reports that he quit smoking about 14 years ago. He has never used smokeless tobacco. He reports that he drinks alcohol. He reports that he does not use drugs.   ROS: Unless stated above, all other review of systems negative  PHYSICAL EXAM: VS:  BP (!) 144/90   Pulse 76   Ht 5\' 8"  (1.727 m)   Wt 194 lb 6.4 oz (88.2 kg)   SpO2 97%   BMI 29.56 kg/m  GEN: Well nourished, well developed, in no acute distress  HEENT: normal  Neck: no JVD, carotid  bruits, or masses Cardiac: RRR; no murmurs, rubs, or gallops,no edema  Respiratory:  clear to auscultation bilaterally, normal work of breathing GI: soft, nontender, nondistended, + BS MS: no deformity or atrophy  Skin: warm and dry, no rash Neuro:  Alert and Oriented x 3, Strength and sensation are intact Psych: euthymic mood, full affect     EKG:  EKG 08/12/2017-sinus rhythm 76 nonspecific ST-T wave changes including mild T wave inversion noted in V5, V6, 1, 2, aVF no significant change from prior personally viewed 06/01/16-sinus rhythm 62 with T-wave inversion no change from prior, inferior as well as lateral limb leads. 05/19/15 normal rhythm 65 bpm, T-wave inversion inferiorly as well as laterally beginning in V3 through V6 personally viewed-no change from prior. 01/07/13-Sinus rhythm, T wave inversion in various leads, no change from prior    12/28/13-sinus rhythm, 74, T wave inversions inferiorly, laterally, no significant change from prior   ASSESSMENT AND PLAN:  Coronary artery disease  - Prior circumflex stent in 2009.   Continue with aggressive secondary risk factor prevention including close control of his diabetes.  He is on beta-blocker, statin, aspirin.  Okay to take aspirin 81 mg.  No anginal symptoms described.  Continues to work out quite vigorously.  Essential hypertension  - Good control normally, mildly elevated now.   No changes made today.  Medications reviewed.  Hyperlipidemia -LDL 80 2017, checked by Dr. Dorthy Cooler.  High intensity statin  Diabetes with hypertension  - He works closely with Dr. Raliegh Ip. Continue to promote decreasing A1c.  No changes.  Metformin.  Obstructive sleep apnea  - CPAP, still utilizes, good job.  Six-month follow-up  Signed, Candee Furbish, MD [facility]  08/12/2017 9:16 AM

## 2017-08-12 NOTE — Patient Instructions (Signed)
Medication Instructions:  Your physician recommends that you continue on your current medications as directed. Please refer to the Current Medication list given to you today.  Labwork: None  Testing/Procedures: None  Follow-Up: Your physician wants you to follow-up in: 6 months with Dr. Skains.  You will receive a reminder letter in the mail two months in advance. If you don't receive a letter, please call our office to schedule the follow-up appointment.   Any Other Special Instructions Will Be Listed Below (If Applicable).     If you need a refill on your cardiac medications before your next appointment, please call your pharmacy.   

## 2017-09-10 DIAGNOSIS — E1140 Type 2 diabetes mellitus with diabetic neuropathy, unspecified: Secondary | ICD-10-CM | POA: Diagnosis not present

## 2018-02-02 ENCOUNTER — Other Ambulatory Visit: Payer: Self-pay | Admitting: Cardiology

## 2018-02-18 ENCOUNTER — Encounter: Payer: Self-pay | Admitting: Cardiology

## 2018-02-18 ENCOUNTER — Other Ambulatory Visit: Payer: Self-pay

## 2018-02-18 ENCOUNTER — Ambulatory Visit (INDEPENDENT_AMBULATORY_CARE_PROVIDER_SITE_OTHER): Payer: Commercial Managed Care - PPO | Admitting: Cardiology

## 2018-02-18 VITALS — BP 114/72 | HR 72 | Ht 68.0 in | Wt 188.8 lb

## 2018-02-18 DIAGNOSIS — I10 Essential (primary) hypertension: Secondary | ICD-10-CM | POA: Diagnosis not present

## 2018-02-18 DIAGNOSIS — E7800 Pure hypercholesterolemia, unspecified: Secondary | ICD-10-CM

## 2018-02-18 DIAGNOSIS — E119 Type 2 diabetes mellitus without complications: Secondary | ICD-10-CM | POA: Diagnosis not present

## 2018-02-18 DIAGNOSIS — I2510 Atherosclerotic heart disease of native coronary artery without angina pectoris: Secondary | ICD-10-CM

## 2018-02-18 MED ORDER — HYDROCHLOROTHIAZIDE 25 MG PO TABS: 25.0000 mg | ORAL_TABLET | Freq: Every day | ORAL | 4 refills | Status: DC

## 2018-02-18 MED ORDER — NITROGLYCERIN 0.4 MG SL SUBL: 0.4000 mg | SUBLINGUAL_TABLET | SUBLINGUAL | 3 refills | Status: DC | PRN

## 2018-02-18 MED ORDER — ATORVASTATIN CALCIUM 40 MG PO TABS: 40.0000 mg | ORAL_TABLET | Freq: Every day | ORAL | 1 refills | Status: DC

## 2018-02-18 MED ORDER — LOSARTAN POTASSIUM 100 MG PO TABS: 100.0000 mg | ORAL_TABLET | Freq: Every day | ORAL | 1 refills | Status: DC

## 2018-02-18 NOTE — Progress Notes (Signed)
[place]. [address] [place], [place]  [address] Phone: [phone] Fax:  [phone]  Date:  02/18/2018   ID:  Clarence Jones, DOB Nov 19, 1960, MRN [id number]  PCP:  Clarence Amel, MD   History of Present Illness: Clarence Jones is a 58 y.o. male with coronary artery disease, proximal stent in the circumflex artery placed in September,13 2009, diabetes, hypertension, hyperlipidemia here for followup.   He remains compliant with his medications. He is quite active, lifts weights. His hemoglobin A1c is much better from 8.4 to 6.3. Stairstepper for 60 minutes. He's not lifting is much weight vigorously as he used to. He stopped after he bench pressed 500 pounds.  We previously discussed his creatinine last check of 1.38. Avoidance of NSAIDs.   Sleep study poor. CPAP.   Unfortunately he lost his daughter, headache, meningitis.  Huge [employer].  02/05/17-overall he is doing quite well.  No new complaints.  No chest pain syncope bleeding orthopnea PND.  He is medication compliant.  08/12/2017- still continues to do well.  Working out quite vigorously.  Pushed 550 pounds.  No chest pain fevers chills nausea vomiting syncope.  I asked about stress levels.  02/18/2018- here for the follow-up of coronary artery disease.  Doing well with no anginal symptoms.  Medications reviewed, no myalgias.  No fevers chills nausea vomiting syncope bleeding.  Still quite active.  Wife present with him.  Gave me FMLA form for his wife so that she can attend these appointments with him every 6 months.  Wt Readings from Last 3 Encounters:  02/18/18 188 lb 12.8 oz (85.6 kg)  08/12/17 194 lb 6.4 oz (88.2 kg)  02/05/17 191 lb 12.8 oz (87 kg)     Past Medical History:  Diagnosis Date  . Chest pain    ETT 12/09 - 10 min. no ECG changes.   . Coronary atherosclerosis of native coronary artery   . Diabetes ([place])   . Erectile dysfunction   . Fibromyalgia   . HTN (hypertension)   .  HTN (hypertension)   . Hyperlipidemia     Past Surgical History:  Procedure Laterality Date  . CHOLECYSTECTOMY      Current Outpatient Medications  Medication Sig Dispense Refill  . amLODipine (NORVASC) 2.5 MG tablet TAKE 1 TABLET BY MOUTH ONCE DAILY 90 tablet 1  . aspirin 81 MG tablet Take 81 mg by mouth daily.    Marland Kitchen aspirin-acetaminophen-caffeine (EXCEDRIN MIGRAINE) 250-250-65 MG per tablet Take 1 tablet by mouth as directed.     . carvedilol (COREG) 6.25 MG tablet Take 1 tablet (6.25 mg total) by mouth 2 (two) times daily with a meal. 60 tablet 11  . cetirizine (ZYRTEC) 10 MG tablet Take 10 mg by mouth daily as needed for allergies.    Marland Kitchen gabapentin (NEURONTIN) 300 MG capsule Take 300 mg by mouth 3 (three) times daily.    Marland Kitchen glipiZIDE (GLUCOTROL) 5 MG tablet Take 5 mg by mouth daily before breakfast.    . HYDROCODONE-ACETAMINOPHEN PO Take 1 tablet by mouth 2 (two) times daily as needed (back pain. (Pt unsure of tablet strength)).    . metFORMIN (GLUCOPHAGE) 500 MG tablet Take 2,000 mg by mouth daily.    . naproxen (NAPROSYN) 500 MG tablet Take 500 mg by mouth as directed.     Marland Kitchen atorvastatin (LIPITOR) 40 MG tablet Take 1 tablet (40 mg total) by mouth daily. 90 tablet 1  . hydrochlorothiazide (HYDRODIURIL) 25 MG  tablet Take 1 tablet (25 mg total) by mouth daily. 30 tablet 4  . losartan (COZAAR) 100 MG tablet Take 1 tablet (100 mg total) by mouth daily. 90 tablet 1  . nitroGLYCERIN (NITROSTAT) 0.4 MG SL tablet Place 1 tablet (0.4 mg total) under the tongue every 5 (five) minutes as needed for chest pain (MAX 3 TABLETS). 25 tablet 3   No current facility-administered medications for this visit.     Allergies:   No Known Allergies  Social History:  The patient  reports that he quit smoking about 15 years ago. He has never used smokeless tobacco. He reports current alcohol use. He reports that he does not use drugs.   ROS: Unless stated above, all other review of systems  negative  PHYSICAL EXAM: VS:  BP 114/72   Pulse 72   Ht 5\' 8"  (1.727 m)   Wt 188 lb 12.8 oz (85.6 kg)   SpO2 98%   BMI 28.71 kg/m  GEN: Well nourished, well developed, in no acute distress  HEENT: normal  Neck: no JVD, carotid bruits, or masses Cardiac: RRR; no murmurs, rubs, or gallops,no edema  Respiratory:  clear to auscultation bilaterally, normal work of breathing GI: soft, nontender, nondistended, + BS MS: no deformity or atrophy  Skin: warm and dry, no rash Neuro:  Alert and Oriented x 3, Strength and sensation are intact Psych: euthymic mood, full affect     EKG:  EKG 08/12/2017-sinus rhythm 76 nonspecific ST-T wave changes including mild T wave inversion noted in V5, V6, 1, 2, aVF no significant change from prior personally viewed 06/01/16-sinus rhythm 62 with T-wave inversion no change from prior, inferior as well as lateral limb leads. 05/19/15 normal rhythm 65 bpm, T-wave inversion inferiorly as well as laterally beginning in V3 through V6 personally viewed-no change from prior. 01/07/13-Sinus rhythm, T wave inversion in various leads, no change from prior    12/28/13-sinus rhythm, 74, T wave inversions inferiorly, laterally, no significant change from prior   ASSESSMENT AND PLAN:  Coronary artery disease  - Prior circumflex stent in 2009.   Continue with aggressive secondary risk factor prevention including close control of his diabetes.  He is on beta-blocker, statin, aspirin.  Okay to take aspirin 81 mg.  No anginal symptoms described.  Continues to work out quite vigorously.  Still feeling well.  Medications reviewed again.  Doing well.  Essential hypertension  -  No changes made today.  Excellent control.  Medications reviewed  Hyperlipidemia -LDL 80 2017, checked by Dr. Dorthy Jones.  High intensity statin.  Continue.  No myalgias.  Diabetes with hypertension  - He works closely with Dr. Raliegh Jones. Continue to promote decreasing A1c.  No changes.  Metformin.  No changes  made.  Obstructive sleep apnea  - CPAP, good job.  Continue to utilize.  FMLA so he can make these appointments with his wife has been filed.  Six-month follow-up  Signed, Clarence Jones Furbish, MD [facility]  02/18/2018 9:34 AM

## 2018-02-18 NOTE — Patient Instructions (Signed)
Medication Instructions:  The current medical regimen is effective;  continue present plan and medications.  If you need a refill on your cardiac medications before your next appointment, please call your pharmacy.   Follow-Up: At [facility], you and your health needs are our priority.  As part of our continuing mission to provide you with exceptional heart care, we have created designated Provider Care Teams.  These Care Teams include your primary Cardiologist (physician) and Advanced Practice Providers (APPs -  Physician Assistants and Nurse Practitioners) who all work together to provide you with the care you need, when you need it. You will need a follow up appointment in 6 months.  Please call our office 2 months in advance to schedule this appointment.  You may see Mark Skains, MD or one of the following Advanced Practice Providers on your designated Care Team:   Lori Gerhardt, NP Laura Ingold, NP . Jill McDaniel, NP  Thank you for choosing [facility]!!       

## 2018-02-27 ENCOUNTER — Other Ambulatory Visit: Payer: Self-pay | Admitting: Cardiology

## 2018-03-28 DIAGNOSIS — Z125 Encounter for screening for malignant neoplasm of prostate: Secondary | ICD-10-CM | POA: Diagnosis not present

## 2018-03-28 DIAGNOSIS — E119 Type 2 diabetes mellitus without complications: Secondary | ICD-10-CM | POA: Diagnosis not present

## 2018-08-04 ENCOUNTER — Other Ambulatory Visit: Payer: Self-pay | Admitting: Cardiology

## 2018-09-05 ENCOUNTER — Other Ambulatory Visit: Payer: Self-pay | Admitting: Cardiology

## 2018-10-13 ENCOUNTER — Other Ambulatory Visit: Payer: Self-pay

## 2018-10-13 ENCOUNTER — Encounter: Payer: Self-pay | Admitting: Cardiology

## 2018-10-13 ENCOUNTER — Ambulatory Visit (INDEPENDENT_AMBULATORY_CARE_PROVIDER_SITE_OTHER): Payer: Commercial Managed Care - PPO | Admitting: Cardiology

## 2018-10-13 VITALS — BP 130/80 | HR 68 | Ht 68.0 in | Wt 187.0 lb

## 2018-10-13 DIAGNOSIS — E7800 Pure hypercholesterolemia, unspecified: Secondary | ICD-10-CM | POA: Diagnosis not present

## 2018-10-13 DIAGNOSIS — I10 Essential (primary) hypertension: Secondary | ICD-10-CM

## 2018-10-13 DIAGNOSIS — E119 Type 2 diabetes mellitus without complications: Secondary | ICD-10-CM

## 2018-10-13 DIAGNOSIS — I2510 Atherosclerotic heart disease of native coronary artery without angina pectoris: Secondary | ICD-10-CM

## 2018-10-13 MED ORDER — GLIPIZIDE 10 MG PO TABS: 10.0000 mg | ORAL_TABLET | Freq: Every day | ORAL | 3 refills | Status: AC

## 2018-10-13 NOTE — Progress Notes (Signed)
[place]. [address] [place], [place]  [address] Phone: [phone] Fax:  [phone]  Date:  10/13/2018   ID:  Clarence Jones, DOB November 17, 1960, MRN [id number]  PCP:  Lujean Amel, MD   History of Present Illness: Clarence Jones is a 58 y.o. male with coronary artery disease, proximal stent in the circumflex artery placed in September,13 2009, diabetes, hypertension, hyperlipidemia here for followup.   He remains compliant with his medications. He is quite active, lifts weights. His hemoglobin A1c is much better from 8.4 to 6.3. Stairstepper for 60 minutes. He's not lifting is much weight vigorously as he used to. He stopped after he bench pressed 500 pounds.  We previously discussed his creatinine last check of 1.38. Avoidance of NSAIDs.   Sleep study poor. CPAP.   Unfortunately he lost his daughter, headache, meningitis.  Huge [employer].  02/05/17-overall he is doing quite well.  No new complaints.  No chest pain syncope bleeding orthopnea PND.  He is medication compliant.  08/12/2017- still continues to do well.  Working out quite vigorously.  Pushed 550 pounds.  No chest pain fevers chills nausea vomiting syncope.  I asked about stress levels.  02/18/2018- here for the follow-up of coronary artery disease.  Doing well with no anginal symptoms.  Medications reviewed, no myalgias.  No fevers chills nausea vomiting syncope bleeding.  Still quite active.  Wife present with him.  Gave me FMLA form for his wife so that she can attend these appointments with him every 6 months.  10/13/2018- here once again for the follow-up of coronary disease.  Prior stent in 2009.  Has been doing quite well continuing to exercise vigorously.  Denies any fevers chills nausea vomiting syncope bleeding.  Taking his medications.  Wt Readings from Last 3 Encounters:  10/13/18 187 lb (84.8 kg)  02/18/18 188 lb 12.8 oz (85.6 kg)  08/12/17 194 lb 6.4 oz (88.2 kg)     Past  Medical History:  Diagnosis Date  . Chest pain    ETT 12/09 - 10 min. no ECG changes.   . Coronary atherosclerosis of native coronary artery   . Diabetes ([place])   . Erectile dysfunction   . Fibromyalgia   . HTN (hypertension)   . HTN (hypertension)   . Hyperlipidemia     Past Surgical History:  Procedure Laterality Date  . CHOLECYSTECTOMY      Current Outpatient Medications  Medication Sig Dispense Refill  . amLODipine (NORVASC) 2.5 MG tablet Take 1 tablet by mouth once daily 90 tablet 1  . aspirin 81 MG tablet Take 81 mg by mouth daily.    Marland Kitchen aspirin-acetaminophen-caffeine (EXCEDRIN MIGRAINE) 250-250-65 MG per tablet Take 1 tablet by mouth as directed.     Marland Kitchen atorvastatin (LIPITOR) 40 MG tablet Take 1 tablet (40 mg total) by mouth daily. 90 tablet 1  . carvedilol (COREG) 6.25 MG tablet Take 1 tablet (6.25 mg total) by mouth 2 (two) times daily with a meal. 60 tablet 11  . cetirizine (ZYRTEC) 10 MG tablet Take 10 mg by mouth daily as needed for allergies.    Marland Kitchen gabapentin (NEURONTIN) 300 MG capsule Take 300 mg by mouth 3 (three) times daily.    Marland Kitchen glipiZIDE (GLUCOTROL) 10 MG tablet Take 1 tablet (10 mg total) by mouth daily before breakfast. 90 tablet 3  . hydrochlorothiazide (HYDRODIURIL) 25 MG tablet Take 1 tablet (25 mg total) by mouth daily. 30 tablet 4  .  HYDROCODONE-ACETAMINOPHEN PO Take 1 tablet by mouth 2 (two) times daily as needed (back pain. (Pt unsure of tablet strength)).    Marland Kitchen losartan (COZAAR) 100 MG tablet Take 1 tablet by mouth once daily 90 tablet 1  . metFORMIN (GLUCOPHAGE) 500 MG tablet Take 2,000 mg by mouth daily.    . naproxen (NAPROSYN) 500 MG tablet Take 500 mg by mouth as directed.     . nitroGLYCERIN (NITROSTAT) 0.4 MG SL tablet Place 1 tablet (0.4 mg total) under the tongue every 5 (five) minutes as needed for chest pain (MAX 3 TABLETS). 25 tablet 3   No current facility-administered medications for this visit.     Allergies:   No Known Allergies  Social  History:  The patient  reports that he quit smoking about 15 years ago. He has never used smokeless tobacco. He reports current alcohol use. He reports that he does not use drugs.   ROS: Unless stated above, all other review of systems negative  PHYSICAL EXAM: VS:  BP 130/80   Pulse 68   Ht 5\' 8"  (1.727 m)   Wt 187 lb (84.8 kg)   SpO2 97%   BMI 28.43 kg/m  GEN: Well nourished, well developed, in no acute distress  HEENT: normal  Neck: no JVD, carotid bruits, or masses Cardiac: RRR; no murmurs, rubs, or gallops,no edema  Respiratory:  clear to auscultation bilaterally, normal work of breathing GI: soft, nontender, nondistended, + BS MS: no deformity or atrophy  Skin: warm and dry, no rash Neuro:  Alert and Oriented x 3, Strength and sensation are intact Psych: euthymic mood, full affect     EKG:  EKG 10/13/2018 -sinus rhythm 68 with nonspecific T wave changes no significant change from prior personally reviewed-08/12/2017-sinus rhythm 76 nonspecific ST-T wave changes including mild T wave inversion noted in V5, V6, 1, 2, aVF no significant change from prior personally viewed 06/01/16-sinus rhythm 62 with T-wave inversion no change from prior, inferior as well as lateral limb leads. 05/19/15 normal rhythm 65 bpm, T-wave inversion inferiorly as well as laterally beginning in V3 through V6 personally viewed-no change from prior. 01/07/13-Sinus rhythm, T wave inversion in various leads, no change from prior    12/28/13-sinus rhythm, 74, T wave inversions inferiorly, laterally, no significant change from prior   ASSESSMENT AND PLAN:  Coronary artery disease  - Prior circumflex stent in 2009.   Continue with aggressive secondary risk factor prevention including close control of his diabetes.  He is on beta-blocker, statin, aspirin.  Okay to take aspirin 81 mg.  No anginal symptoms described.  Continues to work out quite vigorously.  Still feeling well.  Medications reviewed again.  Doing well.  No  anginal symptoms.  Continuing to walk.  Essential hypertension  -  No changes made today.  Excellent control.  Medications reviewed  Hyperlipidemia -LDL 80 2017, checked by Dr. Dorthy Cooler.  High intensity statin.  Continue.  No myalgias.  Diabetes with hypertension  - Glipizide [facility] to 10mg  [facility]. we will write this for him.  Obstructive sleep apnea  - CPAP, good job.  Continue to utilize.  FMLA so he can make these appointments with his wife has been filed once again this year. Copied.   Six-month follow-up  Signed, Candee Furbish, MD [facility]  10/13/2018 7:17 PM

## 2018-10-13 NOTE — Patient Instructions (Signed)
Your physician recommends that you continue on your current medications as directed. Please refer to the Current Medication list given to you today.   Your physician wants you to follow-up in:6 MONTHS WITH DR SKAINS  You will receive a reminder letter in the mail two months in advance. If you don't receive a letter, please call our office to schedule the follow-up appointment.  

## 2019-01-19 ENCOUNTER — Other Ambulatory Visit: Payer: Self-pay | Admitting: Cardiology

## 2019-01-26 ENCOUNTER — Other Ambulatory Visit: Payer: Self-pay | Admitting: Cardiology

## 2019-04-13 ENCOUNTER — Ambulatory Visit (INDEPENDENT_AMBULATORY_CARE_PROVIDER_SITE_OTHER): Payer: Medicare Other | Admitting: Cardiology

## 2019-04-13 ENCOUNTER — Other Ambulatory Visit: Payer: Self-pay

## 2019-04-13 ENCOUNTER — Encounter: Payer: Self-pay | Admitting: Cardiology

## 2019-04-13 VITALS — BP 160/90 | HR 70 | Ht 68.0 in | Wt 185.8 lb

## 2019-04-13 DIAGNOSIS — I10 Essential (primary) hypertension: Secondary | ICD-10-CM | POA: Diagnosis not present

## 2019-04-13 DIAGNOSIS — I2510 Atherosclerotic heart disease of native coronary artery without angina pectoris: Secondary | ICD-10-CM

## 2019-04-13 DIAGNOSIS — E7800 Pure hypercholesterolemia, unspecified: Secondary | ICD-10-CM | POA: Diagnosis not present

## 2019-04-13 DIAGNOSIS — E119 Type 2 diabetes mellitus without complications: Secondary | ICD-10-CM

## 2019-04-13 MED ORDER — HYDROCHLOROTHIAZIDE 25 MG PO TABS: 25.0000 mg | ORAL_TABLET | Freq: Every day | ORAL | 3 refills | Status: DC

## 2019-04-13 MED ORDER — LOSARTAN POTASSIUM 100 MG PO TABS: 100.0000 mg | ORAL_TABLET | Freq: Every day | ORAL | 3 refills | Status: DC

## 2019-04-13 MED ORDER — AMLODIPINE BESYLATE 2.5 MG PO TABS: 2.5000 mg | ORAL_TABLET | Freq: Every day | ORAL | 3 refills | Status: AC

## 2019-04-13 NOTE — Progress Notes (Signed)
[place]. [address], [address] [place], [place]  [address] Phone: [phone] Fax:  [phone]  Date:  04/13/2019   ID:  Clarence Jones, DOB 31-Jul-1960, MRN [id number]  PCP:  Lujean Amel, MD   History of Present Illness: Clarence Jones is a 59 y.o. male with coronary artery disease, proximal stent in the circumflex artery placed in September,13 2009, diabetes, hypertension, hyperlipidemia here for followup.   He remains compliant with his medications. He is quite active, lifts weights. His hemoglobin A1c is much better from 8.4 to 6.3. Stairstepper for 60 minutes. He's not lifting is much weight vigorously as he used to. He stopped after he bench pressed 500 pounds.  We previously discussed his creatinine last check of 1.38. Avoidance of NSAIDs.   Sleep study poor. CPAP.   Unfortunately he lost his daughter, headache, meningitis.  Huge [employer].  02/05/17-overall he is doing quite well.  No new complaints.  No chest pain syncope bleeding orthopnea PND.  He is medication compliant.  08/12/2017- still continues to do well.  Working out quite vigorously.  Pushed 550 pounds.  No chest pain fevers chills nausea vomiting syncope.  I asked about stress levels.  02/18/2018- here for the follow-up of coronary artery disease.  Doing well with no anginal symptoms.  Medications reviewed, no myalgias.  No fevers chills nausea vomiting syncope bleeding.  Still quite active.  Wife present with him.  Gave me FMLA form for his wife so that she can attend these appointments with him every 6 months.  10/13/2018- here once again for the follow-up of coronary disease.  Prior stent in 2009.  Has been doing quite well continuing to exercise vigorously.  Denies any fevers chills nausea vomiting syncope bleeding.  Taking his medications.  04/13/2019-here for the follow-up of coronary artery disease.  Doing well without any anginal symptoms.  Fortunately his wife job is currently on hold.   He does not have health insurance.  Blood pressure has been a little bit high, increased stress.  May not be able to refill some of the medications.  Was asking for samples.  [employer] ring.  Wt Readings from Last 3 Encounters:  04/13/19 185 lb 12.8 oz (84.3 kg)  10/13/18 187 lb (84.8 kg)  02/18/18 188 lb 12.8 oz (85.6 kg)     Past Medical History:  Diagnosis Date  . Chest pain    ETT 12/09 - 10 min. no ECG changes.   . Coronary atherosclerosis of native coronary artery   . Diabetes ([place])   . Erectile dysfunction   . Fibromyalgia   . HTN (hypertension)   . HTN (hypertension)   . Hyperlipidemia     Past Surgical History:  Procedure Laterality Date  . CHOLECYSTECTOMY      Current Outpatient Medications  Medication Sig Dispense Refill  . amLODipine (NORVASC) 2.5 MG tablet Take 1 tablet (2.5 mg total) by mouth daily. 30 tablet 3  . aspirin 81 MG tablet Take 81 mg by mouth daily.    Marland Kitchen aspirin-acetaminophen-caffeine (EXCEDRIN MIGRAINE) 250-250-65 MG per tablet Take 1 tablet by mouth as directed.     Marland Kitchen atorvastatin (LIPITOR) 40 MG tablet Take 1 tablet by mouth once daily 90 tablet 3  . carvedilol (COREG) 6.25 MG tablet Take 1 tablet (6.25 mg total) by mouth 2 (two) times daily with a meal. 60 tablet 11  . cetirizine (ZYRTEC) 10 MG tablet Take 10 mg by mouth daily as needed for  allergies.    Marland Kitchen gabapentin (NEURONTIN) 300 MG capsule Take 300 mg by mouth 3 (three) times daily.    Marland Kitchen glipiZIDE (GLUCOTROL) 10 MG tablet Take 1 tablet (10 mg total) by mouth daily before breakfast. 90 tablet 3  . hydrochlorothiazide (HYDRODIURIL) 25 MG tablet Take 1 tablet (25 mg total) by mouth daily. 30 tablet 3  . HYDROCODONE-ACETAMINOPHEN PO Take 1 tablet by mouth 2 (two) times daily as needed (back pain. (Pt unsure of tablet strength)).    Marland Kitchen losartan (COZAAR) 100 MG tablet Take 1 tablet (100 mg total) by mouth daily. 30 tablet 3  . metFORMIN (GLUCOPHAGE) 500 MG tablet Take 2,000 mg by mouth daily.    .  naproxen (NAPROSYN) 500 MG tablet Take 500 mg by mouth as directed.     . nitroGLYCERIN (NITROSTAT) 0.4 MG SL tablet Place 1 tablet (0.4 mg total) under the tongue every 5 (five) minutes as needed for chest pain (MAX 3 TABLETS). 25 tablet 3   No current facility-administered medications for this visit.    Allergies:   No Known Allergies  Social History:  The patient  reports that he quit smoking about 16 years ago. He has never used smokeless tobacco. He reports current alcohol use. He reports that he does not use drugs.   ROS: Unless stated above, all other review of systems negative  PHYSICAL EXAM: VS:  BP (!) 160/90   Pulse 70   Ht 5\' 8"  (1.727 m)   Wt 185 lb 12.8 oz (84.3 kg)   SpO2 98%   BMI 28.25 kg/m  GEN: Well nourished, well developed, in no acute distress  HEENT: normal  Neck: no JVD, carotid bruits, or masses Cardiac: RRR; no murmurs, rubs, or gallops,no edema  Respiratory:  clear to auscultation bilaterally, normal work of breathing GI: soft, nontender, nondistended, + BS MS: no deformity or atrophy  Skin: warm and dry, no rash Neuro:  Alert and Oriented x 3, Strength and sensation are intact Psych: euthymic mood, full affect      EKG:  EKG 10/13/2018 -sinus rhythm 68 with nonspecific T wave changes no significant change from prior personally reviewed-08/12/2017-sinus rhythm 76 nonspecific ST-T wave changes including mild T wave inversion noted in V5, V6, 1, 2, aVF no significant change from prior personally viewed 06/01/16-sinus rhythm 62 with T-wave inversion no change from prior, inferior as well as lateral limb leads. 05/19/15 normal rhythm 65 bpm, T-wave inversion inferiorly as well as laterally beginning in V3 through V6 personally viewed-no change from prior. 01/07/13-Sinus rhythm, T wave inversion in various leads, no change from prior    12/28/13-sinus rhythm, 74, T wave inversions inferiorly, laterally, no significant change from prior   ASSESSMENT AND PLAN:   Coronary artery disease  - Prior circumflex stent in 2009.   Continue with aggressive secondary risk factor prevention including close control of his diabetes.  He is on beta-blocker, statin, aspirin.  Okay to take aspirin 81 mg.  No anginal symptoms described.  Continues to work out quite vigorously.  Still feeling well.  Medications reviewed again.  Doing well.  No anginal symptoms.  Continuing to walk.  Exercising.  Rowing machine.  Essential hypertension  -  No changes made today.  Little bit elevated over the last few checks.  Medications reviewed.  We will go ahead and refill.  Hyperlipidemia -LDL 80 2017, checked by Dr. Dorthy Cooler.  I will encourage him to get it checked at the next visit.  High intensity statin.  Continue.  No myalgias.  Diabetes with hypertension  -Follow[facility].  He is seeing his doctor in May.  Obstructive sleep apnea  - CPAP, good job.  Continue to utilize.  No change.   Six-month follow-up  Signed, Candee Furbish, MD [facility]  04/13/2019 9:27 AM

## 2019-04-13 NOTE — Patient Instructions (Signed)
Medication Instructions:   Your physician recommends that you continue on your current medications as directed. Please refer to the Current Medication list given to you today.  PLEASE TAKE ALL YOUR REFILLS FOR LOSARTAN, AMLODIPINE, AND HYDROCHLOROTHIAZIDE TO [facility] NEAR YOU TO HAVE FILLED.  THESE PRESCRIPTIONS WERE PRINTED OFF FOR YOU PER DR. Marlou Porch.  BOTH LOSARTAN AND AMLODIPINE WILL COST YOU $9 FOR A 30 DAY SUPPLY AND YOUR HYDROCHLOROTHIAZIDE WILL COST YOU $4 FOR A 30 DAY SUPPLY.  THIS WILL [facility].  *If you need a refill on your cardiac medications before your next appointment, please call your pharmacy*   Follow-Up: At [facility], you and your health needs are our priority.  As part of our continuing mission to provide you with exceptional heart care, we have created designated Provider Care Teams.  These Care Teams include your primary Cardiologist (physician) and Advanced Practice Providers (APPs -  Physician Assistants and Nurse Practitioners) who all work together to provide you with the care you need, when you need it.  We recommend signing up for the patient portal called "MyChart".  Sign up information is provided on this After Visit Summary.  MyChart is used to connect with patients for Virtual Visits (Telemedicine).  Patients are able to view lab/test results, encounter notes, upcoming appointments, etc.  Non-urgent messages can be sent to your provider as well.   To learn more about what you can do with MyChart, go to [email or web address].    Your next appointment:   6 month(s)  The format for your next appointment:   In Person  Provider:   Candee Furbish, MD

## 2019-06-25 ENCOUNTER — Encounter: Payer: Self-pay | Admitting: Gastroenterology

## 2019-07-24 ENCOUNTER — Other Ambulatory Visit: Payer: Self-pay

## 2019-07-24 ENCOUNTER — Ambulatory Visit (AMBULATORY_SURGERY_CENTER): Payer: Self-pay | Admitting: *Deleted

## 2019-07-24 VITALS — Ht 68.0 in | Wt 192.0 lb

## 2019-07-24 DIAGNOSIS — Z1211 Encounter for screening for malignant neoplasm of colon: Secondary | ICD-10-CM

## 2019-07-24 MED ORDER — PEG-KCL-NACL-NASULF-NA ASC-C 100 G PO SOLR: 1.0000 | Freq: Once | ORAL | 0 refills | Status: AC

## 2019-07-24 MED ORDER — PEG-KCL-NACL-NASULF-NA ASC-C 100 G PO SOLR: 1.0000 | Freq: Once | ORAL | 0 refills | Status: DC

## 2019-07-24 NOTE — Progress Notes (Signed)
Faxed movi prep rx to [facility] [place].

## 2019-07-24 NOTE — Addendum Note (Signed)
Addended by: Levonne Spiller on: 07/24/2019 12:28 PM   Modules accepted: Orders

## 2019-07-24 NOTE — Progress Notes (Signed)
Patient is here in-person for PV. Patient denies any allergies to eggs or soy. Patient denies any problems with anesthesia/sedation. Patient denies any oxygen use at home. Patient denies taking any diet/weight loss medications or blood thinners. Patient is not being treated for MRSA or C-diff. Patient is aware of our care-partner policy and TDVVO-16 safety protocol. EMMI education assisgned to the patient for the procedure, this was explained and instructions given to patient.   COVID-19 vaccines completed on 05/10/19 per pt.

## 2019-08-07 ENCOUNTER — Other Ambulatory Visit: Payer: Self-pay

## 2019-08-07 ENCOUNTER — Ambulatory Visit (AMBULATORY_SURGERY_CENTER): Payer: No Typology Code available for payment source | Admitting: Gastroenterology

## 2019-08-07 ENCOUNTER — Encounter: Payer: Self-pay | Admitting: Gastroenterology

## 2019-08-07 VITALS — BP 127/76 | HR 60 | Temp 96.8°F | Resp 18 | Ht 68.0 in | Wt 192.0 lb

## 2019-08-07 DIAGNOSIS — Z1211 Encounter for screening for malignant neoplasm of colon: Secondary | ICD-10-CM

## 2019-08-07 DIAGNOSIS — D122 Benign neoplasm of ascending colon: Secondary | ICD-10-CM

## 2019-08-07 DIAGNOSIS — D120 Benign neoplasm of cecum: Secondary | ICD-10-CM

## 2019-08-07 MED ORDER — SODIUM CHLORIDE 0.9 % IV SOLN: 500.0000 mL | Freq: Once | INTRAVENOUS | Status: DC

## 2019-08-07 NOTE — Progress Notes (Signed)
To PACU, VSS. Report to [email or web address] 

## 2019-08-07 NOTE — Progress Notes (Signed)
VS by CW  Pt's states no medical or surgical changes since previsit or office visit.  

## 2019-08-07 NOTE — Patient Instructions (Signed)
Please read handouts provided. Continue present medications. Await pathology results.   YOU HAD AN ENDOSCOPIC PROCEDURE TODAY AT THE [facility]:   Refer to the procedure report that was given to you for any specific questions about what was found during the examination.  If the procedure report does not answer your questions, please call your gastroenterologist to clarify.  If you requested that your care partner not be given the details of your procedure findings, then the procedure report has been included in a sealed envelope for you to review at your convenience later.  YOU SHOULD EXPECT: Some feelings of bloating in the abdomen. Passage of more gas than usual.  Walking can help get rid of the air that was put into your GI tract during the procedure and reduce the bloating. If you had a lower endoscopy (such as a colonoscopy or flexible sigmoidoscopy) you may notice spotting of blood in your stool or on the toilet paper. If you underwent a bowel prep for your procedure, you may not have a normal bowel movement for a few days.  Please Note:  You might notice some irritation and congestion in your nose or some drainage.  This is from the oxygen used during your procedure.  There is no need for concern and it should clear up in a day or so.  SYMPTOMS TO REPORT IMMEDIATELY:  Following lower endoscopy (colonoscopy or flexible sigmoidoscopy):  Excessive amounts of blood in the stool  Significant tenderness or worsening of abdominal pains  Swelling of the abdomen that is new, acute  Fever of 100F or higher   For urgent or emergent issues, a gastroenterologist can be reached at any hour by calling [phone]. Do not use MyChart messaging for urgent concerns.    DIET:  We do recommend a small meal at first, but then you may proceed to your regular diet.  Drink plenty of fluids but you should avoid alcoholic beverages for 24 hours.  ACTIVITY:  You should plan to take it easy  for the rest of today and you should NOT DRIVE or use heavy machinery until tomorrow (because of the sedation medicines used during the test).    FOLLOW UP: Our staff will call the number listed on your records 48-72 hours following your procedure to check on you and address any questions or concerns that you may have regarding the information given to you following your procedure. If we do not reach you, we will leave a message.  We will attempt to reach you two times.  During this call, we will ask if you have developed any symptoms of COVID 19. If you develop any symptoms (ie: fever, flu-like symptoms, shortness of breath, cough etc.) before then, please call [phone].  If you test positive for Covid 19 in the 2 weeks post procedure, please call and report this information to us.    If any biopsies were taken you will be contacted by phone or by letter within the next 1-3 weeks.  Please call us at [phone] if you have not heard about the biopsies in 3 weeks.    SIGNATURES/CONFIDENTIALITY: You and/or your care partner have signed paperwork which will be entered into your electronic medical record.  These signatures attest to the fact that that the information above on your After Visit Summary has been reviewed and is understood.  Full responsibility of the confidentiality of this discharge information lies with you and/or your care-partner.  

## 2019-08-07 NOTE — Op Note (Signed)
[facility] Patient Name: Clarence Jones Procedure Date: 08/07/2019 10:16 AM MRN: [id number] Endoscopist: [place]. Loletha Carrow , MD Age: 59 Referring MD:  Date of Birth: 1960/10/04 Gender: Male Account #: [id number] Procedure:                Colonoscopy Indications:              Screening for colorectal malignant neoplasm (last                            colonoscopy 2010) Medicines:                Monitored Anesthesia Care Procedure:                Pre-Anesthesia Assessment:                           - Prior to the procedure, a History and Physical                            was performed, and patient medications and                            allergies were reviewed. The patient's tolerance of                            previous anesthesia was also reviewed. The risks                            and benefits of the procedure and the sedation                            options and risks were discussed with the patient.                            All questions were answered, and informed consent                            was obtained. Prior Anticoagulants: The patient has                            taken no previous anticoagulant or antiplatelet                            agents. ASA Grade Assessment: III - A patient with                            severe systemic disease. After reviewing the risks                            and benefits, the patient was deemed in                            satisfactory condition to undergo the procedure.  After obtaining informed consent, the colonoscope                            was passed under direct vision. Throughout the                            procedure, the patient's blood pressure, pulse, and                            oxygen saturations were monitored continuously. The                            Colonoscope was introduced through the anus and                            advanced to the the cecum, identified by                             appendiceal orifice and ileocecal valve. The                            colonoscopy was performed without difficulty. The                            patient tolerated the procedure well. The quality                            of the bowel preparation was good after lavage. The                            ileocecal valve, appendiceal orifice, and rectum                            were photographed. The bowel preparation used was                            MoviPrep. Scope In: 10:46:37 AM Scope Out: 11:07:17 AM Scope Withdrawal Time: 0 hours 13 minutes 5 seconds  Total Procedure Duration: 0 hours 20 minutes 40 seconds  Findings:                 The perianal and digital rectal examinations were                            normal.                           Two flat and sessile polyps were found in the                            cecum. The polyps were diminutive in size. These                            polyps were removed with a cold biopsy forceps.  Resection and retrieval were complete.                           Two sessile polyps were found in the ascending                            colon. The polyps were 2 to 4 mm in size. These                            polyps were removed with a cold snare. Resection                            and retrieval were complete.                           The exam was otherwise without abnormality on                            direct and retroflexion views. Complications:            No immediate complications. Estimated Blood Loss:     Estimated blood loss was minimal. Impression:               - Two diminutive polyps in the cecum, removed with                            a cold biopsy forceps. Resected and retrieved.                           - Two 2 to 4 mm polyps in the ascending colon,                            removed with a cold snare. Resected and retrieved.                           - The examination  was otherwise normal on direct                            and retroflexion views. Recommendation:           - Patient has a contact number available for                            emergencies. The signs and symptoms of potential                            delayed complications were discussed with the                            patient. Return to normal activities tomorrow.                            Written discharge instructions were provided to the  patient.                           - Resume previous diet.                           - Continue present medications.                           - Await pathology results.                           - Repeat colonoscopy is recommended for                            surveillance. The colonoscopy date will be                            determined after pathology results from today's                            exam become available for review. Tylin Force L. Loletha Carrow, MD 08/07/2019 11:12:54 AM This report has been signed electronically.

## 2019-08-11 ENCOUNTER — Telehealth: Payer: Self-pay | Admitting: *Deleted

## 2019-08-11 NOTE — Telephone Encounter (Signed)
1. Have you developed a fever since your procedure? no  2.   Have you had an respiratory symptoms (SOB or cough) since your procedure? no  3.   Have you tested positive for COVID 19 since your procedure no  4.   Have you had any family members/close contacts diagnosed with the COVID 19 since your procedure?  no   If yes to any of these questions please route to Joylene John, RN and Erenest Rasher, RN Follow up Call-  Call back number 08/07/2019  Post procedure Call Back phone  # [phone]  Permission to leave phone message Yes  Some recent data might be hidden     Patient questions:  Do you have a fever, pain , or abdominal swelling? No. Pain Score  0 *  Have you tolerated food without any problems? Yes.    Have you been able to return to your normal activities? Yes.    Do you have any questions about your discharge instructions: Diet   No. Medications  No. Follow up visit  No.  Do you have questions or concerns about your Care? No.  Actions: * If pain score is 4 or above: No action needed, pain <4.

## 2019-08-17 ENCOUNTER — Encounter: Payer: Self-pay | Admitting: Gastroenterology

## 2019-10-20 ENCOUNTER — Other Ambulatory Visit: Payer: Self-pay

## 2019-10-20 ENCOUNTER — Encounter: Payer: Self-pay | Admitting: Cardiology

## 2019-10-20 ENCOUNTER — Ambulatory Visit (INDEPENDENT_AMBULATORY_CARE_PROVIDER_SITE_OTHER): Payer: Medicare Other | Admitting: Cardiology

## 2019-10-20 VITALS — BP 130/70 | HR 56 | Ht 68.0 in | Wt 193.0 lb

## 2019-10-20 DIAGNOSIS — I2510 Atherosclerotic heart disease of native coronary artery without angina pectoris: Secondary | ICD-10-CM | POA: Diagnosis not present

## 2019-10-20 DIAGNOSIS — E7800 Pure hypercholesterolemia, unspecified: Secondary | ICD-10-CM | POA: Diagnosis not present

## 2019-10-20 DIAGNOSIS — I10 Essential (primary) hypertension: Secondary | ICD-10-CM | POA: Diagnosis not present

## 2019-10-20 DIAGNOSIS — E119 Type 2 diabetes mellitus without complications: Secondary | ICD-10-CM | POA: Diagnosis not present

## 2019-10-20 NOTE — Patient Instructions (Signed)

## 2019-10-20 NOTE — Progress Notes (Signed)
Cardiology Office Note:    Date:  10/20/2019   ID:  Clarence Jones, DOB [birth year], MRN [id number]  PCP:  Lujean Amel, MD  [facility] Cardiologist:  Candee Furbish, MD  [facility] Electrophysiologist:  None   Referring MD: Lujean Amel, MD     History of Present Illness:    Clarence Jones is a 59 y.o. male here for follow-up of coronary artery disease.  Proximal circumflex stent placed October 12, 2007.  Cardiac catheterization reviewed.  Doing well without any anginal symptoms.  No shortness of breath no syncope no bleeding.  Exercising well.  Working on a CPAP machine.  Has other comorbidities of hypertension hyperlipidemia and diabetes  Medication compliance excellent.,  Weight lifting.  Creatinine from outside labs had been 1.38.  Staying away from NSAIDs.  Enjoys the [employer].  Wears CPAP.  His daughter died a few years back after suffering from meningitis.  Past Medical History:  Diagnosis Date  . Anxiety   . Chest pain    ETT 12/09 - 10 min. no ECG changes.   . Coronary atherosclerosis of native coronary artery   . Diabetes ([place])   . Erectile dysfunction   . Fibromyalgia   . HTN (hypertension)   . HTN (hypertension)   . Hyperlipidemia   . Sleep apnea    uses CPAP    Past Surgical History:  Procedure Laterality Date  . CHOLECYSTECTOMY    . COLONOSCOPY  05/2008   Dr.Hayes-normal exam per pt  . CORONARY ANGIOPLASTY WITH STENT PLACEMENT  2009    Current Medications: Current Meds  Medication Sig  . amLODipine (NORVASC) 2.5 MG tablet Take 1 tablet (2.5 mg total) by mouth daily.  Marland Kitchen aspirin 81 MG tablet Take 81 mg by mouth daily.  Marland Kitchen aspirin-acetaminophen-caffeine (EXCEDRIN MIGRAINE) 250-250-65 MG per tablet Take 1 tablet by mouth as needed for headache (Take as directed).   Marland Kitchen atorvastatin (LIPITOR) 40 MG tablet Take 1 tablet by mouth once daily  . carvedilol (COREG) 6.25 MG tablet Take 1 tablet (6.25 mg total) by mouth 2 (two)  times daily with a meal.  . fexofenadine (ALLEGRA) 180 MG tablet Take 180 mg by mouth as needed.   . gabapentin (NEURONTIN) 300 MG capsule Take 300 mg by mouth 3 (three) times daily.  Marland Kitchen glipiZIDE (GLUCOTROL) 10 MG tablet Take 1 tablet (10 mg total) by mouth daily before breakfast.  . hydrochlorothiazide (HYDRODIURIL) 25 MG tablet Take 1 tablet (25 mg total) by mouth daily.  Marland Kitchen HYDROCODONE-ACETAMINOPHEN PO Take 1 tablet by mouth 2 (two) times daily as needed (back pain. (Pt unsure of tablet strength)).  Marland Kitchen losartan (COZAAR) 100 MG tablet Take 1 tablet (100 mg total) by mouth daily.  . metFORMIN (GLUCOPHAGE) 500 MG tablet Take 1,000 mg by mouth daily.   . naproxen (NAPROSYN) 500 MG tablet Take 500 mg by mouth as needed (Take as directed for pain).   . nitroGLYCERIN (NITROSTAT) 0.4 MG SL tablet Place 1 tablet (0.4 mg total) under the tongue every 5 (five) minutes as needed for chest pain (MAX 3 TABLETS).  Marland Kitchen VITAMIN D PO Take by mouth.     Allergies:   Patient has no known allergies.   Social History   Socioeconomic History  . Marital status: Married    Spouse name: Not on file  . Number of children: Not on file  . Years of education: Not on file  . Highest education level: Not on file  Occupational History  . Not on  file  Tobacco Use  . Smoking status: Former Smoker    Quit date: 01/30/2003    Years since quitting: 16.7  . Smokeless tobacco: Never Used  Vaping Use  . Vaping Use: Never used  Substance and Sexual Activity  . Alcohol use: Not Currently    Comment: socially  . Drug use: No  . Sexual activity: Not on file  Other Topics Concern  . Not on file  Social History Narrative  . Not on file   Social Determinants of Health   Financial Resource Strain:   . Difficulty of Paying Living Expenses: Not on file  Food Insecurity:   . Worried About Charity fundraiser in the Last Year: Not on file  . Ran Out of Food in the Last Year: Not on file  Transportation Needs:   . Lack of  Transportation (Medical): Not on file  . Lack of Transportation (Non-Medical): Not on file  Physical Activity:   . Days of Exercise per Week: Not on file  . Minutes of Exercise per Session: Not on file  Stress:   . Feeling of Stress : Not on file  Social Connections:   . Frequency of Communication with Friends and Family: Not on file  . Frequency of Social Gatherings with Friends and Family: Not on file  . Attends Religious Services: Not on file  . Active Member of Clubs or Organizations: Not on file  . Attends Archivist Meetings: Not on file  . Marital Status: Not on file     Family History: The patient's family history includes Diabetes in his mother. There is no history of Colon cancer, Colon polyps, Esophageal cancer, Rectal cancer, or Stomach cancer.  ROS:   Please see the history of present illness.     All other systems reviewed and are negative.  EKGs/Labs/Other Studies Reviewed:     EKG:  EKG is  ordered today.  The ekg ordered today demonstrates sinus bradycardia 56 nonspecific T wave changes from prior.  Prior EKG showed sinus rhythm 68 with nonspecific ST-T wave changes Recent Labs: No results found for requested labs within last 8760 hours.  Recent Lipid Panel    Component Value Date/Time   CHOL (H) 10/14/2007 0319    207        ATP III CLASSIFICATION:  <200     mg/dL   Desirable  200-239  mg/dL   Borderline High  >=240    mg/dL   High   TRIG 103 10/14/2007 0319   HDL 35 (L) 10/14/2007 0319   CHOLHDL 5.9 10/14/2007 0319   VLDL 21 10/14/2007 0319   LDLCALC (H) 10/14/2007 0319    151        Total Cholesterol/HDL:CHD Risk Coronary Heart Disease Risk Table                     Men   Women  1/2 Average Risk   3.4   3.3    Physical Exam:    VS:  BP 130/70   Pulse (!) 56   Ht 5\' 8"  (1.727 m)   Wt 193 lb (87.5 kg)   SpO2 98%   BMI 29.35 kg/m     Wt Readings from Last 3 Encounters:  10/20/19 193 lb (87.5 kg)  08/07/19 192 lb (87.1 kg)    07/24/19 192 lb (87.1 kg)     GEN:  Well nourished, well developed in no acute distress HEENT: Normal NECK: No JVD; No  carotid bruits LYMPHATICS: No lymphadenopathy CARDIAC: RRR, no murmurs, rubs, gallops RESPIRATORY:  Clear to auscultation without rales, wheezing or rhonchi  ABDOMEN: Soft, non-tender, non-distended MUSCULOSKELETAL:  No edema; No deformity  SKIN: Warm and dry NEUROLOGIC:  Alert and oriented x 3 PSYCHIATRIC:  Normal affect   ASSESSMENT:    1. Atherosclerosis of native coronary artery of native heart without angina pectoris   2. Diabetes mellitus with coincident hypertension ([place])   3. Pure hypercholesterolemia    PLAN:    In order of problems listed above:  Coronary artery disease-circumflex stent -Circumflex then placed in 2009. -Aggressive secondary risk factor prevention.  Continues with beta-blocker statin aspirin. -No anginal symptoms currently.  Doing well.  Essential hypertension with diabetes -Followed by [facility].  Blood pressure has been slightly elevated during prior visits.  We went ahead and refilled his medications.  Hyperlipidemia -Dr. Dorthy Cooler previously checked.  LDL goal less than 70.  High intensity statin.  He is not having trouble medications.  Continue with prescription.  Actually, he tells me that the [place] has drawn his labs most recently.  Prior LDL from outside labs was 80, hemoglobin A A1c was 6.6 but he states that it was 7 at [place].  Creatinine 1.5 in 2019.  Obstructive sleep apnea -Continue to utilize CPAP.  Was having some trouble with his CPAP machine.  No changes.   Medication Adjustments/Labs and Tests Ordered: Current medicines are reviewed at length with the patient today.  Concerns regarding medicines are outlined above.  Orders Placed This Encounter  Procedures  . EKG 12-Lead   No orders of the defined types were placed in this encounter.   Patient Instructions  Medication Instructions:  The current medical regimen is  effective;  continue present plan and medications.  *If you need a refill on your cardiac medications before your next appointment, please call your pharmacy*  Follow-Up: At [facility], you and your health needs are our priority.  As part of our continuing mission to provide you with exceptional heart care, we have created designated Provider Care Teams.  These Care Teams include your primary Cardiologist (physician) and Advanced Practice Providers (APPs -  Physician Assistants and Nurse Practitioners) who all work together to provide you with the care you need, when you need it.  We recommend signing up for the patient portal called "MyChart".  Sign up information is provided on this After Visit Summary.  MyChart is used to connect with patients for Virtual Visits (Telemedicine).  Patients are able to view lab/test results, encounter notes, upcoming appointments, etc.  Non-urgent messages can be sent to your provider as well.   To learn more about what you can do with MyChart, go to [email or web address].    Your next appointment:   6 month(s)  The format for your next appointment:   In Person  Provider:   Candee Furbish, MD   Thank you for choosing [facility]!!        Signed, Candee Furbish, MD  10/20/2019 9:39 AM    [place]

## 2020-01-07 ENCOUNTER — Telehealth: Payer: Self-pay | Admitting: Cardiology

## 2020-01-07 MED ORDER — NITROGLYCERIN 0.4 MG SL SUBL: 0.4000 mg | SUBLINGUAL_TABLET | SUBLINGUAL | 3 refills | Status: AC | PRN

## 2020-01-07 NOTE — Telephone Encounter (Signed)
Pt c/o of Chest Pain: STAT if CP now or developed within 24 hours  1. Are you having CP right now? Not with the patient  2. Are you experiencing any other symptoms (ex. SOB, nausea, vomiting, sweating)? SOB walking up the steps  3. How long have you been experiencing CP? Not sure  4. Is your CP continuous or coming and going? Comes and goes  5. Have you taken Nitroglycerin? no   Patient's wife states the patient has been having chest pain. She is at work and is not currently with the patient. She states she is not sure how long the patient has been having the chest pain and is not sure if he had it today. She states he also gets SOB when walking up the steps. She would like someone to call the patient at: [phone] ?

## 2020-01-07 NOTE — Telephone Encounter (Signed)
Spoke with pt and since beginning of October has had chest pain comes and goes on daily basis  "feels like being punched in the chest " at times and other times indigestion feeling Per pt sometimes is continuous and other times goes away Pt also has SOB occasionally [facility]  Stress test and sounds like was abnormal Pt is scheduled for cath on 01/13/20 Pt wanted to Let Dr Marlou Porch know .

## 2020-01-08 NOTE — Telephone Encounter (Signed)
Called him at home. Has cath upcoming in [place]. If PCI needed would go to [facility] he states. Abnormal stress test.  He was appreciative of call.   Candee Furbish, MD

## 2020-01-14 ENCOUNTER — Other Ambulatory Visit: Payer: Self-pay

## 2020-01-14 ENCOUNTER — Ambulatory Visit
Admission: RE | Admit: 2020-01-14 | Discharge: 2020-01-14 | Disposition: A | Discharge status: Home / Self Care | Payer: Self-pay | Source: Ambulatory Visit | Attending: Cardiology | Admitting: Cardiology

## 2020-01-14 DIAGNOSIS — I2510 Atherosclerotic heart disease of native coronary artery without angina pectoris: Secondary | ICD-10-CM

## 2020-01-15 ENCOUNTER — Telehealth: Payer: Self-pay | Admitting: Cardiology

## 2020-01-15 NOTE — Telephone Encounter (Signed)
-----   Message from Jettie Booze, MD sent at 01/15/2020  8:23 AM EST ----- Regarding: RE: RCA CTO Thanks Elta [place],  I think his anatomy is reasonable to try for CTO PCI.   Our next CTO day is Jan 19th so I can see him sometime before then if he is still having sx on medical therapy.  JV ----- Message ----- From: Jerline Pain, MD Sent: 01/14/2020   5:50 PM EST To: Jettie Booze, MD Subject: RCA CTO                                        Ulice Dash,  Can you look at his cath. 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 [facility]  Now on Imdur. Has RCA CTO.   He's pretty young and active. Do you think he would be a CTO candidate.   Thanks  [employer]

## 2020-01-15 NOTE — Telephone Encounter (Signed)
Spoke with Mr. Gallier on the phone.  He will watch out for any anginal type symptoms.  He will let us know if he begins to feel any worsening symptoms.  We do have a follow-up appointment in March 1.  He will let me know sooner if assistance is necessary.  He is currently on isosorbide 30 mg a day.  He would be amenable to Dr. Irish Lack performing CTO procedure on him, especially since he performed his previous circumflex stent.  Candee Furbish, MD

## 2020-03-29 ENCOUNTER — Other Ambulatory Visit: Payer: Self-pay

## 2020-03-29 ENCOUNTER — Ambulatory Visit (INDEPENDENT_AMBULATORY_CARE_PROVIDER_SITE_OTHER): Payer: Medicare Other | Admitting: Cardiology

## 2020-03-29 ENCOUNTER — Ambulatory Visit: Payer: Medicare Other | Admitting: Cardiology

## 2020-03-29 ENCOUNTER — Encounter: Payer: Self-pay | Admitting: Cardiology

## 2020-03-29 VITALS — BP 124/80 | HR 68 | Ht 68.0 in | Wt 190.0 lb

## 2020-03-29 DIAGNOSIS — I2510 Atherosclerotic heart disease of native coronary artery without angina pectoris: Secondary | ICD-10-CM | POA: Diagnosis not present

## 2020-03-29 DIAGNOSIS — E7800 Pure hypercholesterolemia, unspecified: Secondary | ICD-10-CM | POA: Diagnosis not present

## 2020-03-29 NOTE — Progress Notes (Signed)
Cardiology Office Note:    Date:  03/29/2020   ID:  Clarence Jones, DOB 06-Jan-1961, MRN [id number]  PCP:  Lujean Amel, [place]  Cardiologist:  Candee Furbish, MD  Advanced Practice Provider:  No care team member to display Electrophysiologist:  None       Referring MD: Lujean Amel, MD     History of Present Illness:    Clarence Jones is a 60 y.o. male here for follow-up of coronary artery disease.  Prior circumflex stent placed by Dr. Irish Lack on October 12, 2007.  Recent cardiac catheterization a hospital on 01/14/2020 showed flush occlusion of proximal RCA.  This was discussed with Dr. Irish Lack.  Could be amenable to CTO procedure.  Low back and glut pain.   Imdur from [place] - HA at first  Overall he has been doing fairly well.  Unlike the episode of chest discomfort that prompted the heart catheterization, he is not having any significant anginal symptoms at this time.  Past Medical History:  Diagnosis Date  . Anxiety   . Chest pain    ETT 12/09 - 10 min. no ECG changes.   . Coronary atherosclerosis of native coronary artery   . Diabetes ([place])   . Erectile dysfunction   . Fibromyalgia   . HTN (hypertension)   . HTN (hypertension)   . Hyperlipidemia   . Sleep apnea    uses CPAP    Past Surgical History:  Procedure Laterality Date  . CHOLECYSTECTOMY    . COLONOSCOPY  05/2008   Dr.Hayes-normal exam per pt  . CORONARY ANGIOPLASTY WITH STENT PLACEMENT  2009    Current Medications: Current Meds  Medication Sig  . amLODipine (NORVASC) 2.5 MG tablet Take 1 tablet (2.5 mg total) by mouth daily.  Marland Kitchen aspirin 81 MG tablet Take 81 mg by mouth daily.  Marland Kitchen aspirin-acetaminophen-caffeine (EXCEDRIN MIGRAINE) 250-250-65 MG per tablet Take 1 tablet by mouth as needed for headache (Take as directed).   Marland Kitchen atorvastatin (LIPITOR) 40 MG tablet Take 1 tablet by mouth once daily  . carvedilol (COREG) 6.25 MG tablet Take 1 tablet (6.25 mg total) by  mouth 2 (two) times daily with a meal.  . fexofenadine (ALLEGRA) 180 MG tablet Take 180 mg by mouth as needed.   . gabapentin (NEURONTIN) 300 MG capsule Take 300 mg by mouth 3 (three) times daily.  Marland Kitchen glipiZIDE (GLUCOTROL) 10 MG tablet Take 1 tablet (10 mg total) by mouth daily before breakfast.  . hydrochlorothiazide (HYDRODIURIL) 25 MG tablet Take 1 tablet (25 mg total) by mouth daily.  Marland Kitchen HYDROCODONE-ACETAMINOPHEN PO Take 1 tablet by mouth 2 (two) times daily as needed (back pain. (Pt unsure of tablet strength)).  . isosorbide mononitrate (IMDUR) 30 MG 24 hr tablet Take 1 tablet by mouth daily.  Marland Kitchen losartan (COZAAR) 100 MG tablet Take 1 tablet (100 mg total) by mouth daily.  . metFORMIN (GLUCOPHAGE) 500 MG tablet Take 1,000 mg by mouth daily.   . metoprolol tartrate (LOPRESSOR) 50 MG tablet Take 25 mg by mouth daily.  . naproxen (NAPROSYN) 500 MG tablet Take 500 mg by mouth as needed (Take as directed for pain).   . nitroGLYCERIN (NITROSTAT) 0.4 MG SL tablet Place 1 tablet (0.4 mg total) under the tongue every 5 (five) minutes as needed for chest pain (MAX 3 TABLETS).  . pantoprazole (PROTONIX) 40 MG tablet Take 40 mg by mouth at bedtime.  Marland Kitchen VITAMIN D PO Take by mouth.  Allergies:   Patient has no known allergies.   Social History   Socioeconomic History  . Marital status: Married    Spouse name: Not on file  . Number of children: Not on file  . Years of education: Not on file  . Highest education level: Not on file  Occupational History  . Not on file  Tobacco Use  . Smoking status: Former Smoker    Quit date: 01/30/2003    Years since quitting: 17.1  . Smokeless tobacco: Never Used  Vaping Use  . Vaping Use: Never used  Substance and Sexual Activity  . Alcohol use: Not Currently    Comment: socially  . Drug use: No  . Sexual activity: Not on file  Other Topics Concern  . Not on file  Social History Narrative  . Not on file   Social Determinants of Health   Financial  Resource Strain: Not on file  Food Insecurity: Not on file  Transportation Needs: Not on file  Physical Activity: Not on file  Stress: Not on file  Social Connections: Not on file     Family History: The patient's family history includes Diabetes in his mother. There is no history of Colon cancer, Colon polyps, Esophageal cancer, Rectal cancer, or Stomach cancer.  ROS:   Please see the history of present illness.     All other systems reviewed and are negative.  EKGs/Labs/Other Studies Reviewed:    The following studies were reviewed today: Cardiac catheterization/angiogram was personally reviewed with him   Recent Labs: No results found for requested labs within last 8760 hours.  Recent Lipid Panel    Component Value Date/Time   CHOL (H) 10/14/2007 0319    207        ATP III CLASSIFICATION:  <200     mg/dL   Desirable  200-239  mg/dL   Borderline High  >=240    mg/dL   High   TRIG 103 10/14/2007 0319   HDL 35 (L) 10/14/2007 0319   CHOLHDL 5.9 10/14/2007 0319   VLDL 21 10/14/2007 0319   LDLCALC (H) 10/14/2007 0319    151        Total Cholesterol/HDL:CHD Risk Coronary Heart Disease Risk Table                     Men   Women  1/2 Average Risk   3.4   3.3     Risk Assessment/Calculations:      Physical Exam:    VS:  BP 124/80 (BP Location: Left Arm, Patient Position: Sitting, Cuff Size: Normal)   Pulse 68   Ht 5\' 8"  (1.727 m)   Wt 190 lb (86.2 kg)   SpO2 97%   BMI 28.89 kg/m     Wt Readings from Last 3 Encounters:  03/29/20 190 lb (86.2 kg)  10/20/19 193 lb (87.5 kg)  08/07/19 192 lb (87.1 kg)     GEN:  Well nourished, well developed in no acute distress HEENT: Normal NECK: No JVD; No carotid bruits LYMPHATICS: No lymphadenopathy CARDIAC: RRR, no murmurs, rubs, gallops RESPIRATORY:  Clear to auscultation without rales, wheezing or rhonchi  ABDOMEN: Soft, non-tender, non-distended MUSCULOSKELETAL:  No edema; No deformity  SKIN: Warm and  dry NEUROLOGIC:  Alert and oriented x 3 PSYCHIATRIC:  Normal affect   ASSESSMENT:    1. Atherosclerosis of native coronary artery of native heart without angina pectoris   2. Pure hypercholesterolemia    PLAN:  In order of problems listed above:  Coronary artery disease -Prior circumflex stent 2009 patent -Cardiac catheterization December 2021 showed flush occlusion of proximal RCA at [facility].  Outside films are scanned into our system and can be viewed. March [facility].  He will let me know what they say.  We are here for him if he would like for [place] to set him up with Dr. Irish Lack. -Currently on antianginals of carvedilol.  Has nitroglycerin as well. -Taking atorvastatin 40 mg high intensity dose. -Likely taking isosorbide 30 mg [facility].  I do not have this on his medication list currently.  He described a headache with this medication.   Hyperlipidemia -LDL goal less than 70 currently on high intensity statin.  No myalgias.  Prior LDL from outside labs was 80.  Hemoglobin A1c 6.6-7.  Obstructive sleep apnea -Continue with CPAP.      Medication Adjustments/Labs and Tests Ordered: Current medicines are reviewed at length with the patient today.  Concerns regarding medicines are outlined above.  No orders of the defined types were placed in this encounter.  No orders of the defined types were placed in this encounter.   Patient Instructions  Medication Instructions:  The current medical regimen is effective;  continue present plan and medications.  *If you need a refill on your cardiac medications before your next appointment, please call your pharmacy*  Follow-Up: At [facility], you and your health needs are our priority.  As part of our continuing mission to provide you with exceptional heart care, we have created designated Provider Care Teams.  These Care Teams include your primary Cardiologist (physician) and Advanced Practice  Providers (APPs -  Physician Assistants and Nurse Practitioners) who all work together to provide you with the care you need, when you need it.  We recommend signing up for the patient portal called "MyChart".  Sign up information is provided on this After Visit Summary.  MyChart is used to connect with patients for Virtual Visits (Telemedicine).  Patients are able to view lab/test results, encounter notes, upcoming appointments, etc.  Non-urgent messages can be sent to your provider as well.   To learn more about what you can do with MyChart, go to [email or web address].    Your next appointment:   6 month(s)  The format for your next appointment:   In Person  Provider:   Candee Furbish, MD   Thank you for choosing [facility]!!        Signed, Candee Furbish, MD  03/29/2020 10:25 AM    [place]

## 2020-03-29 NOTE — Patient Instructions (Addendum)

## 2020-09-29 NOTE — Progress Notes (Signed)
Cardiology Office Note:    Date:  09/30/2020   ID:  Clarence Jones, DOB 01-13-1961, MRN [id number]  PCP:  Lujean Amel, [place]  Cardiologist:  Candee Furbish, MD  Advanced Practice Provider:  No care team member to display Electrophysiologist:  None       Referring MD: Lujean Amel, MD    History of Present Illness:    Clarence Jones is a 60 y.o. male here for the follow-up of coronary artery disease and hypertension.  Prior circumflex stent placed by Dr. Irish Lack on October 12, 2007.  Recent cardiac catheterization a hospital on 01/14/2020 showed flush occlusion of proximal RCA.  This was discussed with Dr. Irish Lack.  Could be amenable to CTO procedure.  Low back and glut pain.   Imdur from [place] - HA at first  Overall he had been doing fairly well.  Unlike the episode of chest discomfort that prompted the heart catheterization, he is not having any significant anginal symptoms at this time.   Today, overall he has been feeling well.  No chest pain no shortness of breath.  Working out frequently.  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  [facility]  They are holding off on intervention at this time.  Since he is not having any anginal symptoms, seems wise to continue with medical management approach.  Past Medical History:  Diagnosis Date   Anxiety    Chest pain    ETT 12/09 - 10 min. no ECG changes.    Coronary atherosclerosis of native coronary artery    Diabetes [facility])    Erectile dysfunction    Fibromyalgia    HTN (hypertension)    HTN (hypertension)    Hyperlipidemia    Sleep apnea    uses CPAP    Past Surgical History:  Procedure Laterality Date   CHOLECYSTECTOMY     COLONOSCOPY  05/2008   Dr.Hayes-normal exam per pt   CORONARY ANGIOPLASTY WITH STENT PLACEMENT  2009    Current Medications: Current Meds  Medication Sig   amLODipine (NORVASC)  2.5 MG tablet Take 1 tablet (2.5 mg total) by mouth daily.   aspirin 81 MG tablet Take 81 mg by mouth daily.   aspirin-acetaminophen-caffeine (EXCEDRIN MIGRAINE) 250-250-65 MG per tablet Take 1 tablet by mouth as needed for headache (Take as directed).    atorvastatin (LIPITOR) 40 MG tablet Take 1 tablet by mouth once daily   carvedilol (COREG) 6.25 MG tablet Take 1 tablet (6.25 mg total) by mouth 2 (two) times daily with a meal.   fexofenadine (ALLEGRA) 180 MG tablet Take 180 mg by mouth as needed.    gabapentin (NEURONTIN) 300 MG capsule Take 300 mg by mouth 3 (three) times daily.   glipiZIDE (GLUCOTROL) 10 MG tablet Take 1 tablet (10 mg total) by mouth daily before breakfast.   hydrochlorothiazide (HYDRODIURIL) 25 MG tablet Take 1 tablet (25 mg total) by mouth daily.   HYDROCODONE-ACETAMINOPHEN PO Take 1 tablet by mouth 2 (two) times daily as needed (back pain. (Pt unsure of tablet strength)).   isosorbide mononitrate (IMDUR) 30 MG 24 hr tablet Take 1 tablet by mouth daily.   losartan (COZAAR) 100 MG tablet Take 1 tablet (100 mg total) by mouth daily.   metFORMIN (GLUCOPHAGE) 500 MG tablet Take 1,000 mg by mouth daily.    metoprolol tartrate (LOPRESSOR) 50 MG tablet Take 25 mg by mouth daily.  naproxen (NAPROSYN) 500 MG tablet Take 500 mg by mouth as needed (Take as directed for pain).    nitroGLYCERIN (NITROSTAT) 0.4 MG SL tablet Place 1 tablet (0.4 mg total) under the tongue every 5 (five) minutes as needed for chest pain (MAX 3 TABLETS).   pantoprazole (PROTONIX) 40 MG tablet Take 40 mg by mouth at bedtime.   VITAMIN D PO Take by mouth.     Allergies:   Patient has no known allergies.   Social History   Socioeconomic History   Marital status: Married    Spouse name: Not on file   Number of children: Not on file   Years of education: Not on file   Highest education level: Not on file  Occupational History   Not on file  Tobacco Use   Smoking status: Former    Types: Cigarettes     Quit date: 01/30/2003    Years since quitting: 17.6   Smokeless tobacco: Never  Vaping Use   Vaping Use: Never used  Substance and Sexual Activity   Alcohol use: Not Currently    Comment: socially   Drug use: No   Sexual activity: Not on file  Other Topics Concern   Not on file  Social History Narrative   Not on file   Social Determinants of Health   Financial Resource Strain: Not on file  Food Insecurity: Not on file  Transportation Needs: Not on file  Physical Activity: Not on file  Stress: Not on file  Social Connections: Not on file     Family History: The patient's family history includes Diabetes in his mother. There is no history of Colon cancer, Colon polyps, Esophageal cancer, Rectal cancer, or Stomach cancer.  ROS:   Please see the history of present illness.    All other systems reviewed and are negative.  EKGs/Labs/Other Studies Reviewed:    The following studies were reviewed today: Cardiac catheterization/angiogram was personally reviewed with him   EKG:   EKG is personally reviewed and interpreted. 09/30/2020: Sinus rhythm 63 T wave inversion noted in V3 through V5, no significant changes from prior  Recent Labs: No results found for requested labs within last 8760 hours.  Recent Lipid Panel    Component Value Date/Time   CHOL (H) 10/14/2007 0319    207        ATP III CLASSIFICATION:  <200     mg/dL   Desirable  200-239  mg/dL   Borderline High  >=240    mg/dL   High   TRIG 103 10/14/2007 0319   HDL 35 (L) 10/14/2007 0319   CHOLHDL 5.9 10/14/2007 0319   VLDL 21 10/14/2007 0319   LDLCALC (H) 10/14/2007 0319    151        Total Cholesterol/HDL:CHD Risk Coronary Heart Disease Risk Table                     Men   Women  1/2 Average Risk   3.4   3.3     Risk Assessment/Calculations:      Physical Exam:    VS:  BP 120/80 (BP Location: Left Arm, Patient Position: Sitting, Cuff Size: Large)   Pulse 63   Ht '5\' 8"'$  (1.727 m)   Wt 188 lb  (85.3 kg)   SpO2 97%   BMI 28.59 kg/m     Wt Readings from Last 3 Encounters:  09/30/20 188 lb (85.3 kg)  03/29/20 190 lb (86.2 kg)  10/20/19  193 lb (87.5 kg)     GEN: Well nourished, well developed in no acute distress HEENT: Normal NECK: No JVD; No carotid bruits LYMPHATICS: No lymphadenopathy CARDIAC: RRR, no murmurs, rubs, gallops RESPIRATORY:  Clear to auscultation without rales, wheezing or rhonchi  ABDOMEN: Soft, non-tender, non-distended MUSCULOSKELETAL:  No edema; No deformity  SKIN: Warm and dry NEUROLOGIC:  Alert and oriented x 3 PSYCHIATRIC:  Normal affect    ASSESSMENT:    1. Atherosclerosis of native coronary artery of native heart without angina pectoris   2. Essential hypertension   3. Pure hypercholesterolemia   4. Obstructive sleep apnea   5. Type 2 diabetes mellitus without complication, without long-term current use of insulin (HCC)     PLAN:    In order of problems listed above: Coronary atherosclerosis of native coronary artery Prior circumflex stent placed in 2009 patent.  Cardiac catheterization in December 2021 showed a occlusion of the proximal RCA at the [place] are scanned into the system.  Dr. Irish Lack has seen him as well in the past.  Continue with antianginal medications, carvedilol, isosorbide.  Doing well.  Exercising vigorously without any difficulties.  It seems reasonable to continue with medical management at this time since he is not having any anginal symptoms.  Hyperlipidemia LDL goal less than 70.  He is checking this at the [facility] hospital.  He is on high intensity dose of atorvastatin 40 mg.  Continue.  Watch diet, exercise.  Prior hemoglobin A1c 6.6.  Mildly elevated.  Obstructive sleep apnea Continue with CPAP.  Doing well.  Diabetes Mildly elevated hemoglobin A1c, on metformin, glipizide.  [facility].  Coronary artery disease -Prior circumflex stent 2009 patent -Cardiac catheterization  December 2021 showed flush occlusion of proximal RCA at [facility].  Outside films are scanned into our system and can be viewed. March 24 Dr. Sabra Heck [facility] Cards.  He will let me know what they say.  We are here for him if he would like for [place] to set him up with Dr. Irish Lack. -Currently on antianginals of carvedilol.  Has nitroglycerin as well. -Taking atorvastatin 40 mg high intensity dose. -Likely taking isosorbide 30 mg as [facility] the [facility].  I do not have this on his medication list currently.  He described a headache with this medication.   Hyperlipidemia -LDL goal less than 70 currently on high intensity statin.  No myalgias.  Prior LDL from outside labs was 80.  Hemoglobin A1c 6.6-7.  Obstructive sleep apnea -Continue with CPAP.   Follow-up: [facility]   Medication Adjustments/Labs and Tests Ordered: Current medicines are reviewed at length with the patient today.  Concerns regarding medicines are outlined above.   Orders Placed This Encounter  Procedures   EKG 12-Lead    No orders of the defined types were placed in this encounter.   Patient Instructions  Medication Instructions:  The current medical regimen is effective;  continue present plan and medications.  *If you need a refill on your cardiac medications before your next appointment, please call your pharmacy*  Follow-Up: [facility] and your [facility] needs are our priority.  As part of our continuing mission to provide you with exceptional heart care, we have created designated Provider Care Teams.  These Care Teams include your primary Cardiologist (physician) and Advanced Practice Providers (APPs -  Physician Assistants and Nurse Practitioners) who all work together to provide you with the care you need, when you need it.  We recommend signing up for the patient portal called "MyChart".  Sign up information is provided on this After Visit Summary.  MyChart is used to connect with patients for Virtual Visits  (Telemedicine).  Patients are able to view lab/test results, encounter notes, upcoming appointments, etc.  Non-urgent messages can be sent to your provider as well.   To learn more about what you can do with MyChart, go to [email or web address].    Your next appointment:   6 month(s)  The format for your next appointment:   In Person  Provider:   Candee Furbish, MD   Thank you for choosing [facility]!!      Signed, Candee Furbish, MD  09/30/2020 8:54 AM    [place]

## 2020-09-30 ENCOUNTER — Other Ambulatory Visit: Payer: Self-pay

## 2020-09-30 ENCOUNTER — Ambulatory Visit (INDEPENDENT_AMBULATORY_CARE_PROVIDER_SITE_OTHER): Payer: Medicare Other | Admitting: Cardiology

## 2020-09-30 ENCOUNTER — Encounter: Payer: Self-pay | Admitting: Cardiology

## 2020-09-30 VITALS — BP 120/80 | HR 63 | Ht 68.0 in | Wt 188.0 lb

## 2020-09-30 DIAGNOSIS — E7800 Pure hypercholesterolemia, unspecified: Secondary | ICD-10-CM

## 2020-09-30 DIAGNOSIS — G4733 Obstructive sleep apnea (adult) (pediatric): Secondary | ICD-10-CM | POA: Diagnosis not present

## 2020-09-30 DIAGNOSIS — I2510 Atherosclerotic heart disease of native coronary artery without angina pectoris: Secondary | ICD-10-CM

## 2020-09-30 DIAGNOSIS — I10 Essential (primary) hypertension: Secondary | ICD-10-CM

## 2020-09-30 DIAGNOSIS — E119 Type 2 diabetes mellitus without complications: Secondary | ICD-10-CM

## 2020-09-30 NOTE — Assessment & Plan Note (Signed)
Mildly elevated hemoglobin A1c, on metformin, glipizide.  [facility].

## 2020-09-30 NOTE — Assessment & Plan Note (Signed)
Prior circumflex stent placed in 2009 patent.  Cardiac catheterization in December 2021 showed a occlusion of the proximal RCA at the [place] are scanned into the system.  Dr. Irish Lack has seen him as well in the past.  Continue with antianginal medications, carvedilol, isosorbide.  Doing well.  Exercising vigorously without any difficulties.  It seems reasonable to continue with medical management at this time since he is not having any anginal symptoms.

## 2020-09-30 NOTE — Patient Instructions (Signed)

## 2020-09-30 NOTE — Assessment & Plan Note (Signed)
LDL goal less than 70.  He is checking this at the [facility] hospital.  He is on high intensity dose of atorvastatin 40 mg.  Continue.  Watch diet, exercise.  Prior hemoglobin A1c 6.6.  Mildly elevated.

## 2020-09-30 NOTE — Assessment & Plan Note (Signed)
Continue with CPAP.  Doing well.

## 2021-01-10 ENCOUNTER — Telehealth: Payer: Self-pay | Admitting: Cardiology

## 2021-01-10 NOTE — Telephone Encounter (Signed)
° °  Pre-operative Risk Assessment    Patient Name: Clarence Jones  DOB: 18-Jul-1960 MRN: [id number]     Request for Surgical Clearance    Procedure:   periodontal scaling & root planting  Date of Surgery:  Clearance TBD                                 Surgeon:  Dr. Annitta Jersey Surgeon's Group or Practice Name:  [facility] Phone number:  [phone] Fax number:  [phone]   Type of Clearance Requested:   - Medical    Type of Anesthesia:  Local    Additional requests/questions:  Does this patient need antibiotics?  Clarence Jones   01/10/2021, 11:25 AM

## 2021-01-10 NOTE — Telephone Encounter (Signed)
° °  Patient Name: Clarence Jones  DOB: 1960-08-11 MRN: [id number]  Primary Cardiologist: Candee Furbish, MD  Chart reviewed as part of pre-operative protocol coverage.   Patient is at acceptable risk to proceed with low risk periodontal and dental procedure. He is very stable from the cardiac perspective.   SBE prophylaxis is not required for the patient from a cardiac standpoint as he does not have a history of endocarditis, heart valve repair or congenital heart disease.   I will route this recommendation to the requesting party via Epic fax function and remove from pre-op pool.  Please call with questions.  [place], [place] 01/10/2021, 1:36 PM

## 2021-03-06 ENCOUNTER — Ambulatory Visit (INDEPENDENT_AMBULATORY_CARE_PROVIDER_SITE_OTHER): Payer: 59 | Admitting: Cardiology

## 2021-03-06 ENCOUNTER — Encounter: Payer: Self-pay | Admitting: Cardiology

## 2021-03-06 ENCOUNTER — Other Ambulatory Visit: Payer: Self-pay

## 2021-03-06 DIAGNOSIS — E7800 Pure hypercholesterolemia, unspecified: Secondary | ICD-10-CM | POA: Diagnosis not present

## 2021-03-06 DIAGNOSIS — I2510 Atherosclerotic heart disease of native coronary artery without angina pectoris: Secondary | ICD-10-CM

## 2021-03-06 NOTE — Patient Instructions (Signed)

## 2021-03-06 NOTE — Assessment & Plan Note (Signed)
Getting lab work at the [place].  Atorvastatin 40 mg high intensity dose.  LDL goal less than 70.

## 2021-03-06 NOTE — Assessment & Plan Note (Signed)
Exercise, diet.  Also on metformin.  Hemoglobin A1c went down to 6.6.  [name].

## 2021-03-06 NOTE — Assessment & Plan Note (Signed)
Prior circumflex stent placed in 2009 patent.  Cardiac catheterization in December 2021 showed a occlusion of the proximal RCA at the [place] are scanned into the system.  Dr. Irish Lack has seen him as well in the past.  Continue with antianginal medications, carvedilol, isosorbide.  Doing well.  Exercising vigorously without any difficulties.  It seems reasonable to continue with medical management at this time since he is not having any anginal symptoms.

## 2021-03-06 NOTE — Progress Notes (Signed)
Cardiology Office Note:    Date:  03/06/2021   ID:  Clarence Jones, DOB Aug 31, 1960, MRN [id number]  PCP:  Lujean Amel, MD   [facility] Providers Cardiologist:  Candee Furbish, MD     Referring MD: Lujean Amel, MD    History of Present Illness:    Clarence Jones is a 61 y.o. male here for follow-up of CAD, hypertension.  Circumflex stent placed in 2009 September 13 by Dr. Irish Lack.  Also had a cardiac catheterization in 2021 December 16 that showed flush occlusion of proximal RCA.  Could be amenable to CTO procedure.  Discussed as well at [place] off on intervention at this time for continued medical therapy since she is not having any significant anginal symptoms.  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  Overall not having any significant anginal symptoms.  Had a piriformis muscle injury.  Used to walk 4 times a day.  Hemoglobin A1c went down to 6.5.  He is excited about [place] going to the [employer].  Past Medical History:  Diagnosis Date   Anxiety    Chest pain    ETT 12/09 - 10 min. no ECG changes.    Coronary atherosclerosis of native coronary artery    Diabetes [facility])    Erectile dysfunction    Fibromyalgia    HTN (hypertension)    HTN (hypertension)    Hyperlipidemia    Sleep apnea    uses CPAP    Past Surgical History:  Procedure Laterality Date   CHOLECYSTECTOMY     COLONOSCOPY  05/2008   Dr.Hayes-normal exam per pt   CORONARY ANGIOPLASTY WITH STENT PLACEMENT  2009    Current Medications: Current Meds  Medication Sig   amLODipine (NORVASC) 2.5 MG tablet Take 1 tablet (2.5 mg total) by mouth daily.   aspirin 81 MG tablet Take 81 mg by mouth daily.   aspirin-acetaminophen-caffeine (EXCEDRIN MIGRAINE) 250-250-65 MG per tablet Take 1 tablet by mouth as needed for headache (Take as directed).    atorvastatin (LIPITOR) 40 MG tablet Take 1 tablet by mouth once daily   carvedilol (COREG) 6.25 MG tablet Take 1  tablet (6.25 mg total) by mouth 2 (two) times daily with a meal.   cyclobenzaprine (FLEXERIL) 10 MG tablet Take 10 tablets by mouth daily as needed.   fexofenadine (ALLEGRA) 180 MG tablet Take 180 mg by mouth as needed.    gabapentin (NEURONTIN) 300 MG capsule Take 300 mg by mouth 3 (three) times daily.   glipiZIDE (GLUCOTROL) 10 MG tablet Take 1 tablet (10 mg total) by mouth daily before breakfast.   hydrochlorothiazide (HYDRODIURIL) 25 MG tablet Take 1 tablet (25 mg total) by mouth daily.   HYDROCODONE-ACETAMINOPHEN PO Take 1 tablet by mouth 2 (two) times daily as needed (back pain. (Pt unsure of tablet strength)).   isosorbide mononitrate (IMDUR) 30 MG 24 hr tablet Take 1 tablet by mouth daily.   losartan (COZAAR) 100 MG tablet Take 1 tablet (100 mg total) by mouth daily.   metFORMIN (GLUCOPHAGE) 1000 MG tablet Take 1,000 mg by mouth daily.   metoprolol tartrate (LOPRESSOR) 50 MG tablet Take 25 mg by mouth daily.   naproxen (NAPROSYN) 500 MG tablet Take 500 mg by mouth as needed (Take as directed for pain).    nitroGLYCERIN (NITROSTAT) 0.4 MG SL tablet Place 1 tablet (0.4 mg total) under the tongue every 5 (five) minutes as needed for chest pain (MAX 3 TABLETS).   pantoprazole (  PROTONIX) 40 MG tablet Take 40 mg by mouth at bedtime.   VITAMIN D PO Take by mouth.     Allergies:   Patient has no known allergies.   Social History   Socioeconomic History   Marital status: Married    Spouse name: Not on file   Number of children: Not on file   Years of education: Not on file   Highest education level: Not on file  Occupational History   Not on file  Tobacco Use   Smoking status: Former    Types: Cigarettes    Quit date: 01/30/2003    Years since quitting: 18.1   Smokeless tobacco: Never  Vaping Use   Vaping Use: Never used  Substance and Sexual Activity   Alcohol use: Not Currently    Comment: socially   Drug use: No   Sexual activity: Not on file  Other Topics Concern   Not on  file  Social History Narrative   Not on file   Social Determinants of Health   Financial Resource Strain: Not on file  Food Insecurity: Not on file  Transportation Needs: Not on file  Physical Activity: Not on file  Stress: Not on file  Social Connections: Not on file     Family History: The patient's family history includes Diabetes in his mother. There is no history of Colon cancer, Colon polyps, Esophageal cancer, Rectal cancer, or Stomach cancer.  ROS:   Please see the history of present illness.    No bleeding no fevers no chills all other systems reviewed and are negative.  EKGs/Labs/Other Studies Reviewed:     Recent Labs: No results found for requested labs within last 8760 hours.  Recent Lipid Panel    Component Value Date/Time   CHOL (H) 10/14/2007 0319    207        ATP III CLASSIFICATION:  <200     mg/dL   Desirable  200-239  mg/dL   Borderline High  >=240    mg/dL   High   TRIG 103 10/14/2007 0319   HDL 35 (L) 10/14/2007 0319   CHOLHDL 5.9 10/14/2007 0319   VLDL 21 10/14/2007 0319   LDLCALC (H) 10/14/2007 0319    151        Total Cholesterol/HDL:CHD Risk Coronary Heart Disease Risk Table                     Men   Women  1/2 Average Risk   3.4   3.3     Risk Assessment/Calculations:              Physical Exam:    VS:  BP 110/72 (BP Location: Left Arm, Patient Position: Sitting, Cuff Size: Normal)    Pulse 66    Ht 5\' 8"  (1.727 m)    Wt 185 lb (83.9 kg)    SpO2 97%    BMI 28.13 kg/m     Wt Readings from Last 3 Encounters:  03/06/21 185 lb (83.9 kg)  09/30/20 188 lb (85.3 kg)  03/29/20 190 lb (86.2 kg)     GEN:  Well nourished, well developed in no acute distress HEENT: Normal NECK: No JVD; No carotid bruits LYMPHATICS: No lymphadenopathy CARDIAC: RRR, no murmurs, no rubs, gallops RESPIRATORY:  Clear to auscultation without rales, wheezing or rhonchi  ABDOMEN: Soft, non-tender, non-distended MUSCULOSKELETAL:  No edema; No deformity   SKIN: Warm and dry NEUROLOGIC:  Alert and oriented x 3 PSYCHIATRIC:  Normal  affect   ASSESSMENT:    1. Atherosclerosis of native coronary artery of native heart without angina pectoris   2. Pure hypercholesterolemia    PLAN:    In order of problems listed above:  Coronary atherosclerosis of native coronary artery Prior circumflex stent placed in 2009 patent.  Cardiac catheterization in December 2021 showed a occlusion of the proximal RCA at the [facility] are scanned into the system.  Dr. Irish Lack has seen him as well in the past.  Continue with antianginal medications, carvedilol, isosorbide.  Doing well.  Exercising vigorously without any difficulties.  It seems reasonable to continue with medical management at this time since he is not having any anginal symptoms.  Hyperlipidemia Getting lab work at the [place].  Atorvastatin 40 mg high intensity dose.  LDL goal less than 70.  Diabetes Exercise, diet.  Also on metformin.  Hemoglobin A1c went down to 6.6.  [place].         Medication Adjustments/Labs and Tests Ordered: Current medicines are reviewed at length with the patient today.  Concerns regarding medicines are outlined above.  No orders of the defined types were placed in this encounter.  No orders of the defined types were placed in this encounter.   Patient Instructions  Medication Instructions:  The current medical regimen is effective;  continue present plan and medications.  *If you need a refill on your cardiac medications before your next appointment, please call your pharmacy*  Follow-Up: At [facility], you and your health needs are our priority.  As part of our continuing mission to provide you with exceptional heart care, we have created designated Provider Care Teams.  These Care Teams include your primary Cardiologist (physician) and Advanced Practice Providers (APPs -  Physician Assistants and Nurse Practitioners) who all work  together to provide you with the care you need, when you need it.  We recommend signing up for the patient portal called "MyChart".  Sign up information is provided on this After Visit Summary.  MyChart is used to connect with patients for Virtual Visits (Telemedicine).  Patients are able to view lab/test results, encounter notes, upcoming appointments, etc.  Non-urgent messages can be sent to your provider as well.   To learn more about what you can do with MyChart, go to [email or web address].    Your next appointment:   6 month(s)  The format for your next appointment:   In Person  Provider:   Candee Furbish, MD     Thank you for choosing [facility]!!      Signed, Candee Furbish, MD  03/06/2021 9:18 AM    [place]

## 2021-10-26 ENCOUNTER — Encounter: Payer: Self-pay | Admitting: Cardiology

## 2021-10-26 ENCOUNTER — Ambulatory Visit: Payer: 59 | Attending: Cardiology | Admitting: Cardiology

## 2021-10-26 VITALS — BP 128/78 | HR 60 | Ht 68.0 in | Wt 188.0 lb

## 2021-10-26 DIAGNOSIS — I10 Essential (primary) hypertension: Secondary | ICD-10-CM

## 2021-10-26 DIAGNOSIS — E782 Mixed hyperlipidemia: Secondary | ICD-10-CM

## 2021-10-26 DIAGNOSIS — I2510 Atherosclerotic heart disease of native coronary artery without angina pectoris: Secondary | ICD-10-CM | POA: Diagnosis not present

## 2021-10-26 DIAGNOSIS — R072 Precordial pain: Secondary | ICD-10-CM | POA: Diagnosis not present

## 2021-10-26 DIAGNOSIS — E119 Type 2 diabetes mellitus without complications: Secondary | ICD-10-CM

## 2021-10-26 NOTE — Progress Notes (Signed)
Cardiology Office Note:    Date:  10/26/2021   ID:  Nashid Pellum, DOB [birth year], MRN [id number]  PCP:  Lujean Amel, MD   [facility] Providers Cardiologist:  Candee Furbish, MD     Referring MD: Lujean Amel, MD    History of Present Illness:    Clarence Jones is a 61 y.o. male here for follow-up of CAD, hypertension.  A circumflex stent was placed in 2009 September 13 by Dr. Irish Lack.  Also had a cardiac catheterization in 2021 December 16 that showed flush occlusion of proximal RCA.  Could be amenable to CTO procedure.  Discussed as well at [place] off on intervention at this time for continued medical therapy since he is not having any significant anginal symptoms.  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  At prior visit on 03/06/2021 he reported that overall he was not having any significant anginal symptoms. He did report a piriformis muscle injury.  He also mentioned he used to walk 4 times a day.  Hemoglobin A1c went down to 6.5.    Today he says is doing well overall today. [facility] is [facility] that he start Jardiance. His kidney values have been elevated historically and the [place] doctor feels the [place] would be a better option. He is expected to start it around the 3rd of October and will follow up with his [place] in February. He wanted to know if this would change his other medications. He recently became a grandparent.   He tries to stay active using the step master for 15-30 minutes a few times a week. He is not able to walk like he used to due to an old periformis issue.    He is monitoring his diet and doing well with it but he is trying to work on cutting down on sweets. He is thinking about restarting the mediterranean diet. He monitors his salt intact and has been using Mrs Deliah Boston as a substitute. He has been drinking calomine  and red root tea.  He denies any palpitations, chest pain, shortness of breath, or peripheral  edema. No lightheadedness, headaches, syncope, orthopnea, or PND.   (+) leg pain right.  Excellent pulses.  He and his wife are now grandparents.  [name]  Past Medical History:  Diagnosis Date   Anxiety    Chest pain    ETT 12/09 - 10 min. no ECG changes.    Coronary atherosclerosis of native coronary artery    Diabetes [facility])    Erectile dysfunction    Fibromyalgia    HTN (hypertension)    HTN (hypertension)    Hyperlipidemia    Sleep apnea    uses CPAP    Past Surgical History:  Procedure Laterality Date   CHOLECYSTECTOMY     COLONOSCOPY  05/2008   Dr.Hayes-normal exam per pt   CORONARY ANGIOPLASTY WITH STENT PLACEMENT  2009    Current Medications: Current Meds  Medication Sig   amLODipine (NORVASC) 2.5 MG tablet Take 1 tablet (2.5 mg total) by mouth daily.   aspirin 81 MG tablet Take 81 mg by mouth daily.   aspirin-acetaminophen-caffeine (EXCEDRIN MIGRAINE) 250-250-65 MG per tablet Take 1 tablet by mouth as needed for headache (Take as directed).    atorvastatin (LIPITOR) 40 MG tablet Take 1 tablet by mouth once daily   carvedilol (COREG) 6.25 MG tablet Take 1 tablet (6.25 mg total) by mouth 2 (two) times daily with a meal.  cyclobenzaprine (FLEXERIL) 10 MG tablet Take 10 tablets by mouth daily as needed.   fexofenadine (ALLEGRA) 180 MG tablet Take 180 mg by mouth as needed.    gabapentin (NEURONTIN) 300 MG capsule Take 300 mg by mouth 3 (three) times daily.   glipiZIDE (GLUCOTROL) 10 MG tablet Take 1 tablet (10 mg total) by mouth daily before breakfast.   hydrochlorothiazide (HYDRODIURIL) 25 MG tablet Take 1 tablet (25 mg total) by mouth daily.   HYDROCODONE-ACETAMINOPHEN PO Take 1 tablet by mouth 2 (two) times daily as needed (back pain. (Pt unsure of tablet strength)).   isosorbide mononitrate (IMDUR) 30 MG 24 hr tablet Take 1 tablet by mouth daily.   losartan (COZAAR) 100 MG tablet Take 1 tablet (100 mg total) by mouth daily.   metFORMIN (GLUCOPHAGE)  1000 MG tablet Take 1,000 mg by mouth daily.   metoprolol tartrate (LOPRESSOR) 50 MG tablet Take 25 mg by mouth daily.   naproxen (NAPROSYN) 500 MG tablet Take 500 mg by mouth as needed (Take as directed for pain).    nitroGLYCERIN (NITROSTAT) 0.4 MG SL tablet Place 1 tablet (0.4 mg total) under the tongue every 5 (five) minutes as needed for chest pain (MAX 3 TABLETS).   pantoprazole (PROTONIX) 40 MG tablet Take 40 mg by mouth at bedtime.   VITAMIN D PO Take by mouth.     Allergies:   Patient has no known allergies.   Social History   Socioeconomic History   Marital status: Married    Spouse name: Not on file   Number of children: Not on file   Years of education: Not on file   Highest education level: Not on file  Occupational History   Not on file  Tobacco Use   Smoking status: Former    Types: Cigarettes    Quit date: 01/30/2003    Years since quitting: 18.7   Smokeless tobacco: Never  Vaping Use   Vaping Use: Never used  Substance and Sexual Activity   Alcohol use: Not Currently    Comment: socially   Drug use: No   Sexual activity: Not on file  Other Topics Concern   Not on file  Social History Narrative   Not on file   Social Determinants of Health   Financial Resource Strain: Not on file  Food Insecurity: Not on file  Transportation Needs: Not on file  Physical Activity: Not on file  Stress: Not on file  Social Connections: Not on file     Family History: The patient's family history includes Diabetes in his mother. There is no history of Colon cancer, Colon polyps, Esophageal cancer, Rectal cancer, or Stomach cancer.  ROS:   Please see the history of present illness.  (+) leg pain   No bleeding no fevers no chills all other systems reviewed and are negative.  EKGs/Labs/Other Studies Reviewed:    No prior cardiovascular studies available.   EKG: EKG is personally reviewed.  10/26/2021: Sinus Rhythm 63 bpm. Nonspecific T-wave changes.  Recent  Labs: No results found for requested labs within last 365 days.  Recent Lipid Panel    Component Value Date/Time   CHOL (H) 10/14/2007 0319    207        ATP III CLASSIFICATION:  <200     mg/dL   Desirable  200-239  mg/dL   Borderline High  >=240    mg/dL   High   TRIG 103 10/14/2007 0319   HDL 35 (L) 10/14/2007 0319  CHOLHDL 5.9 10/14/2007 0319   VLDL 21 10/14/2007 0319   LDLCALC (H) 10/14/2007 0319    151        Total Cholesterol/HDL:CHD Risk Coronary Heart Disease Risk Table                     Men   Women  1/2 Average Risk   3.4   3.3     Risk Assessment/Calculations:              Physical Exam:    VS:  BP 128/78   Pulse 60   Ht '5\' 8"'$  (1.727 m)   Wt 188 lb (85.3 kg)   SpO2 96%   BMI 28.59 kg/m     Wt Readings from Last 3 Encounters:  10/26/21 188 lb (85.3 kg)  03/06/21 185 lb (83.9 kg)  09/30/20 188 lb (85.3 kg)     GEN:  Well nourished, well developed in no acute distress HEENT: Normal NECK: No JVD; No carotid bruits LYMPHATICS: No lymphadenopathy CARDIAC: RRR, no murmurs, no rubs, gallops RESPIRATORY:  Clear to auscultation without rales, wheezing or rhonchi  ABDOMEN: Soft, non-tender, non-distended MUSCULOSKELETAL:  No edema; No deformity, excellent distal pulses SKIN: Warm and dry NEUROLOGIC:  Alert and oriented x 3 PSYCHIATRIC:  Normal affect   ASSESSMENT:    1. Hypertension, unspecified type   2. Precordial pain   3. Atherosclerosis of native coronary artery of native heart without angina pectoris   4. Mixed hyperlipidemia   5. Type 2 diabetes mellitus without complication, without long-term current use of insulin (HCC)     PLAN:    In order of problems listed above:  Coronary atherosclerosis of native coronary artery Prior circumflex stent placed in 2009 patent.  Cardiac catheterization in December 2021 showed a occlusion of the proximal RCA at the [place] are scanned into the system.  Dr. Irish Lack has seen him as  well in the past.  We reviewed.  Continue with antianginal medications, metoprolol isosorbide.  Doing well.  Exercising vigorously without any difficulties.  It seems reasonable to continue with medical management at this time since he is not having any anginal symptoms.   Hyperlipidemia Getting lab work at the [place].  Atorvastatin 40 mg high intensity dose.  LDL goal less than 70.  Overall doing well without any myalgias.   Diabetes Exercise, diet.  Also on metformin.  Hemoglobin A1c went down to 6.6.  Currently 6.9.  [place].  [facility] discussing Jardiance with him.  Excellent.  Piriformis inflammation Stretching, acupuncture.  Still walking on stairmaster.     Follow Up: 6 months  Medication Adjustments/Labs and Tests Ordered: Current medicines are reviewed at length with the patient today.  Concerns regarding medicines are outlined above.  Orders Placed This Encounter  Procedures   EKG 12-Lead   No orders of the defined types were placed in this encounter.   Patient Instructions  Medication Instructions:  Your physician recommends that you continue on your current medications as directed. Please refer to the Current Medication list given to you today.  *If you need a refill on your cardiac medications before your next appointment, please call your pharmacy*  Follow-Up: At [facility], you and your health needs are our priority.  As part of our continuing mission to provide you with exceptional heart care, we have created designated Provider Care Teams.  These Care Teams include your primary Cardiologist (physician) and Advanced Practice Providers (APPs -  Physician Assistants and Nurse Practitioners) who all work together to provide you with the care you need, when you need it.  We recommend signing up for the patient portal called "MyChart".  Sign up information is provided on this After Visit Summary.  MyChart is used to connect with patients for Virtual Visits  (Telemedicine).  Patients are able to view lab/test results, encounter notes, upcoming appointments, etc.  Non-urgent messages can be sent to your provider as well.   To learn more about what you can do with MyChart, go to [email or web address].    Your next appointment:   6 month(s)  The format for your next appointment:   In Person  Provider:   Candee Furbish, MD     Important Information About [place],acting as a scribe for Candee Furbish, MD.,have documented all relevant documentation on the behalf of Candee Furbish, MD,as directed by  Candee Furbish, MD while in the presence of Candee Furbish, MD.   I, Candee Furbish, MD, have reviewed all documentation for this visit. The documentation on 10/26/21 for the exam, diagnosis, procedures, and orders are all accurate and complete.   Signed, Candee Furbish, MD  10/26/2021 9:10 AM    [facility]

## 2021-10-26 NOTE — Patient Instructions (Addendum)
Medication Instructions:  Your physician recommends that you continue on your current medications as directed. Please refer to the Current Medication list given to you today.  *If you need a refill on your cardiac medications before your next appointment, please call your pharmacy*  Follow-Up: At [facility], you and your health needs are our priority.  As part of our continuing mission to provide you with exceptional heart care, we have created designated Provider Care Teams.  These Care Teams include your primary Cardiologist (physician) and Advanced Practice Providers (APPs -  Physician Assistants and Nurse Practitioners) who all work together to provide you with the care you need, when you need it.  We recommend signing up for the patient portal called "MyChart".  Sign up information is provided on this After Visit Summary.  MyChart is used to connect with patients for Virtual Visits (Telemedicine).  Patients are able to view lab/test results, encounter notes, upcoming appointments, etc.  Non-urgent messages can be sent to your provider as well.   To learn more about what you can do with MyChart, go to [email or web address].    Your next appointment:   6 month(s)  The format for your next appointment:   In Person  Provider:   Candee Furbish, MD     Important Information About [place]

## 2022-04-13 ENCOUNTER — Ambulatory Visit: Payer: Medicare Other | Admitting: Cardiology

## 2022-08-03 ENCOUNTER — Ambulatory Visit: Payer: 59 | Attending: Cardiology | Admitting: Cardiology

## 2022-08-03 ENCOUNTER — Encounter: Payer: Self-pay | Admitting: Cardiology

## 2022-08-03 VITALS — BP 128/84 | HR 69 | Ht 68.0 in | Wt 175.0 lb

## 2022-08-03 DIAGNOSIS — I10 Essential (primary) hypertension: Secondary | ICD-10-CM | POA: Diagnosis not present

## 2022-08-03 DIAGNOSIS — E782 Mixed hyperlipidemia: Secondary | ICD-10-CM | POA: Diagnosis not present

## 2022-08-03 DIAGNOSIS — E119 Type 2 diabetes mellitus without complications: Secondary | ICD-10-CM | POA: Diagnosis not present

## 2022-08-03 DIAGNOSIS — I25811 Atherosclerosis of native coronary artery of transplanted heart without angina pectoris: Secondary | ICD-10-CM

## 2022-08-03 DIAGNOSIS — Z7984 Long term (current) use of oral hypoglycemic drugs: Secondary | ICD-10-CM

## 2022-08-03 NOTE — Progress Notes (Signed)
  Cardiology Office Note:  .   Date:  08/03/2022  ID:  Clarence Jones, DOB 10/18/1960, MRN [id number] PCP: Darrow Bussing, MD  [facility] Providers Cardiologist:  Donato Schultz, MD    History of Present Illness: .   Clarence Jones is a 62 y.o. male here for follow-up circumflex stent 2009, cardiac catheterization 2021 flush occlusion of proximal RCA with discussion of possible CTO procedure.  Feels well without any chest pain.  Taking his isosorbide.  New start Jardiance in late 2023 from [place].  A1c is down.  Diabetes Hypertension Hyperlipidemia OSA-CPAP  [employer]  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 Enjoys the gym.  Now grandparents.  Go by the name [name] in [place]  ROS: No fevers chills nausea vomiting syncope bleeding  Studies Reviewed: Marland Kitchen   EKG Interpretation Date/Time:  Friday August 03 2022 07:59:36 EDT Ventricular Rate:  69 PR Interval:  170 QRS Duration:  80 QT Interval:  376 QTC Calculation: 402 R Axis:   51  Text Interpretation: Normal sinus rhythm Nonspecific T wave abnormality When compared with ECG of 15-Oct-2007 06:17, No significant change was found Confirmed by Donato Schultz ([id number]) on 08/03/2022 8:01:41 AM     Risk Assessment/Calculations:            Physical Exam:   VS:  BP 128/84   Pulse 69   Ht 5\' 8"  (1.727 m)   Wt 175 lb (79.4 kg)   BMI 26.61 kg/m    Wt Readings from Last 3 Encounters:  08/03/22 175 lb (79.4 kg)  10/26/21 188 lb (85.3 kg)  03/06/21 185 lb (83.9 kg)    GEN: Well nourished, well developed in no acute distress NECK: No JVD; No carotid bruits CARDIAC: RRR, no murmurs, rubs, gallops RESPIRATORY:  Clear to auscultation without rales, wheezing or rhonchi  ABDOMEN: Soft, non-tender, non-distended EXTREMITIES:  No edema; No deformity   ASSESSMENT AND PLAN: .    Coronary artery disease -Circumflex stent 2009 patent - Cath 2021 occlusion proximal  RCA at the [facility].  Films are scanned into the system.  We reviewed with interventional colleagues including Dr. Eldridge Dace.  Continuing with antianginal medications. - Seems to be doing quite well with this.  Diabetes with hypertension - Continue with exercise.  Hemoglobin A1c has been approximately 6.2 from 6.9.  [facility]. Now on Jardiance late 2023.  Has been doing great with his diet.  Hyperlipidemia - LDL goal less than 70.  Currently on atorvastatin 40 mg high intensity dose without myalgias.  Continue with current medication management.        Dispo: 6 months  Signed, Donato Schultz, MD

## 2022-08-03 NOTE — Patient Instructions (Signed)
Medication Instructions:  The current medical regimen is effective;  continue present plan and medications.  *If you need a refill on your cardiac medications before your next appointment, please call your pharmacy*  Follow-Up: At [facility], you and your health needs are our priority.  As part of our continuing mission to provide you with exceptional heart care, we have created designated Provider Care Teams.  These Care Teams include your primary Cardiologist (physician) and Advanced Practice Providers (APPs -  Physician Assistants and Nurse Practitioners) who all work together to provide you with the care you need, when you need it.  We recommend signing up for the patient portal called "MyChart".  Sign up information is provided on this After Visit Summary.  MyChart is used to connect with patients for Virtual Visits (Telemedicine).  Patients are able to view lab/test results, encounter notes, upcoming appointments, etc.  Non-urgent messages can be sent to your provider as well.   To learn more about what you can do with MyChart, go to [email or web address].    Your next appointment:   6 month(s)  Provider:   Mark Skains, MD     

## 2023-02-11 ENCOUNTER — Ambulatory Visit: Payer: 59 | Attending: Cardiology | Admitting: Cardiology

## 2023-02-11 VITALS — BP 124/78 | HR 78 | Ht 68.0 in | Wt 182.0 lb

## 2023-02-11 DIAGNOSIS — E119 Type 2 diabetes mellitus without complications: Secondary | ICD-10-CM | POA: Insufficient documentation

## 2023-02-11 DIAGNOSIS — I25811 Atherosclerosis of native coronary artery of transplanted heart without angina pectoris: Secondary | ICD-10-CM | POA: Insufficient documentation

## 2023-02-11 DIAGNOSIS — I10 Essential (primary) hypertension: Secondary | ICD-10-CM | POA: Diagnosis not present

## 2023-02-11 NOTE — Progress Notes (Signed)
 Cardiology Office Note:  .   Date:  02/11/2023  ID:  Clarence Jones, DOB November 28, 1960, MRN [id number] PCP: Regino Slater, MD  [facility] Providers Cardiologist:  Oneil Parchment, MD    History of Present Illness: .   Clarence Jones is a 63 y.o. male Discussed the use of AI scribe software for clinical note transcription with the patient, who gave verbal consent to proceed.  History of Present Illness   The patient is a 63 year old male with a history of coronary artery disease, hypertension, hyperlipidemia, diabetes, and obstructive sleep apnea. He had a cardiac catheterization in 2021, which revealed a flush occlusion of the proximal RCA. This followed a catheterization in 2009 with placement of a circumflex stent. The patient has been asymptomatic since the last catheterization and is currently on isosorbide for angina, Jardiance  for diabetes, atorvastatin  for hyperlipidemia, and uses a CPAP machine for sleep apnea.  The patient's LDL levels are being monitored at the [facility] hospital, with the goal of maintaining levels below 70. The patient also reports engaging in regular physical activity, including use of a Step Master and treadmill, as well as strength training exercises. The patient's medication regimen includes atorvastatin  40 mg daily, carvedilol  6.25 mg, amlodipine  2.5 mg, Imdur 30 mg, hydrochlorothiazide  25 mg, losartan  100 mg, and metoprolol  25 mg.  The patient's most recent EKG, compared to previous ones, showed no significant changes. The patient's A1c was reported to be 6.8, and the patient reports feeling generally well. The patient's creatinine levels were reported to have increased slightly, which was attributed to possible dehydration. The patient's liver function and kidney function were reported to be normal.       [employer]  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 Enjoys the gym.  Now  grandparents.  Go by the name [name] in [place]   ROS: no CP, no SOB  Studies Reviewed: SABRA   EKG Interpretation Date/Time:  Monday February 11 2023 07:56:00 EST Ventricular Rate:  78 PR Interval:  168 QRS Duration:  80 QT Interval:  356 QTC Calculation: 405 R Axis:   53  Text Interpretation: Normal sinus rhythm Non-specific ST-t changes When compared with ECG of 03-Aug-2022 07:59, No significant change since last tracing Confirmed by Parchment Oneil ([id number]) on 02/11/2023 8:08:43 AM    Results   LABS HbA1c: 6.8 Creatinine: Slightly elevated  DIAGNOSTIC Cardiac catheterization: Flush occlusion of proximal RCA (2021) Cardiac catheterization: Placement of circumflex stent (2009) EKG: No significant changes, comparable to previous (12/2022)     Risk Assessment/Calculations:            Physical Exam:   VS:  BP 124/78   Pulse 78   Ht 5' 8 (1.727 m)   Wt 182 lb (82.6 kg)   SpO2 95%   BMI 27.67 kg/m    Wt Readings from Last 3 Encounters:  02/11/23 182 lb (82.6 kg)  08/03/22 175 lb (79.4 kg)  10/26/21 188 lb (85.3 kg)    GEN: Well nourished, well developed in no acute distress NECK: No JVD; No carotid bruits CARDIAC: RRR, no murmurs, no rubs, no gallops RESPIRATORY:  Clear to auscultation without rales, wheezing or rhonchi  ABDOMEN: Soft, non-tender, non-distended EXTREMITIES:  No edema; No deformity   ASSESSMENT AND PLAN: .    Assessment and Plan    Coronary Artery Disease (CAD) Follow-up for CAD. Cardiac catheterization in 2021 showed flush occlusion of proximal RCA; 2009 catheterization with circumflex  stent placement. Currently asymptomatic. Reviewed films with interventional colleagues; decision to continue anti-anginal medications and not proceed with CTA due to lack of symptoms. Discussed risks and benefits of current medical management versus further invasive procedures. Patient prefers to avoid further invasive procedures at this time. - Continue anti-anginal  medications: Isosorbide, Imdur 30 mg, carvedilol  6.25 mg, amlodipine  2.5 mg, metoprolol  25 mg - Continue atorvastatin  40 mg daily - Follow-up in 6 months  Hypertension Hypertension managed with multiple medications. - Continue hydrochlorothiazide  25 mg, losartan  100 mg  Hyperlipidemia Hyperlipidemia managed with atorvastatin . LDL < 70. - Continue atorvastatin  40 mg daily  Diabetes Mellitus Diabetes managed with Jardiance . Last hemoglobin A1c was 6.8%. - Continue Jardiance  25 mg  Obstructive Sleep Apnea Obstructive sleep apnea managed with CPAP. - Continue CPAP therapy  General Health Maintenance Maintaining an active lifestyle with regular exercise. Labs monitored by [facility]. - Continue regular exercise routine - [facility] labs as per [facility] schedule  Follow-up - Schedule follow-up appointment in 6 months.               Signed, Oneil Parchment, MD

## 2023-02-11 NOTE — Patient Instructions (Signed)
Medication Instructions:  The current medical regimen is effective;  continue present plan and medications.  *If you need a refill on your cardiac medications before your next appointment, please call your pharmacy*  Follow-Up: At [facility], you and your health needs are our priority.  As part of our continuing mission to provide you with exceptional heart care, we have created designated Provider Care Teams.  These Care Teams include your primary Cardiologist (physician) and Advanced Practice Providers (APPs -  Physician Assistants and Nurse Practitioners) who all work together to provide you with the care you need, when you need it.  We recommend signing up for the patient portal called "MyChart".  Sign up information is provided on this After Visit Summary.  MyChart is used to connect with patients for Virtual Visits (Telemedicine).  Patients are able to view lab/test results, encounter notes, upcoming appointments, etc.  Non-urgent messages can be sent to your provider as well.   To learn more about what you can do with MyChart, go to [email or web address].    Your next appointment:   6 month(s)  Provider:   Candee Furbish, MD

## 2023-08-22 ENCOUNTER — Other Ambulatory Visit (HOSPITAL_COMMUNITY): Payer: Self-pay

## 2023-08-22 ENCOUNTER — Encounter: Payer: Self-pay | Admitting: Cardiology

## 2023-08-22 ENCOUNTER — Ambulatory Visit: Attending: Cardiology | Admitting: Cardiology

## 2023-08-22 VITALS — BP 130/76 | HR 90 | Ht 68.0 in | Wt 185.0 lb

## 2023-08-22 DIAGNOSIS — E782 Mixed hyperlipidemia: Secondary | ICD-10-CM

## 2023-08-22 DIAGNOSIS — I25811 Atherosclerosis of native coronary artery of transplanted heart without angina pectoris: Secondary | ICD-10-CM | POA: Diagnosis not present

## 2023-08-22 DIAGNOSIS — E119 Type 2 diabetes mellitus without complications: Secondary | ICD-10-CM

## 2023-08-22 DIAGNOSIS — I10 Essential (primary) hypertension: Secondary | ICD-10-CM | POA: Diagnosis not present

## 2023-08-22 MED ORDER — HYDROCHLOROTHIAZIDE 25 MG PO TABS
25.0000 mg | ORAL_TABLET | Freq: Every day | ORAL | 3 refills | Status: AC
  Filled 2023-08-22: qty 30, 30d supply, fill #0

## 2023-08-22 MED ORDER — METOPROLOL SUCCINATE ER 50 MG PO TB24
50.0000 mg | ORAL_TABLET | Freq: Every day | ORAL | 3 refills | Status: AC
  Filled 2023-08-22: qty 30, 30d supply, fill #0

## 2023-08-22 MED ORDER — EMPAGLIFLOZIN 25 MG PO TABS
25.0000 mg | ORAL_TABLET | Freq: Every day | ORAL | 3 refills | Status: AC
  Filled 2023-08-22: qty 30, 30d supply, fill #0

## 2023-08-22 MED ORDER — LOSARTAN POTASSIUM 50 MG PO TABS
50.0000 mg | ORAL_TABLET | Freq: Every day | ORAL | 3 refills | Status: AC
  Filled 2023-08-22: qty 30, 30d supply, fill #0

## 2023-08-22 NOTE — Progress Notes (Signed)
 Cardiology Office Note:  .   Date:  08/22/2023  ID:  Clarence Jones, DOB 08/22/1960, MRN [id number] PCP: Regino Slater, MD  [facility] Providers Cardiologist:  Oneil Parchment, MD     History of Present Illness: .   Clarence Jones is a 63 y.o. male Discussed the use of AI scribe software for clinical note transcription with the patient, who gave verbal consent to proceed.  History of Present Illness Clarence Jones is a 63 year old male with coronary artery disease who presents for follow-up of CAD with occlusion of proximal RCA.  He has a history of coronary artery disease with occlusion of the proximal right coronary artery diagnosed in 2021. A cardiac stent was placed in the circumflex artery in 2009.  He recently underwent carotid imaging two days ago due to episodes of facial twitching. He is currently awaiting the results, which are expected in three to five business days.  He has a history of obstructive sleep apnea and is on CPAP therapy, which he continues to use without any new issues.  He experiences issues with medication supply from the [facility], noting delays and receiving only a limited supply of ten pills. He brought his medications for refills, including Jardiance  25 mg, metoprolol  tartrate 50 mg, hydrochlorothiazide  25 mg, losartan  100 mg, and nitroglycerin . He mentions a recent letter indicating a change in his losartan  dosage from 100 mg to 50 mg.  He is currently taking Jardiance  25 mg for diabetes and heart health, metoprolol  tartrate 50 mg once a day, hydrochlorothiazide  25 mg daily, losartan  50 mg daily, and nitroglycerin  as needed. He carries nitroglycerin  with him and informs others at the gym about it for safety.  He notes a hemoglobin A1c of 6.8 in the past and aims to keep his LDL cholesterol below 70.      ROS: No CP, no SOB  Studies Reviewed: .        Results LABS   Hemoglobin A1c: 6.8% Risk Assessment/Calculations:            Physical Exam:    VS:  BP 130/76   Pulse 90   Ht 5' 8 (1.727 m)   Wt 185 lb (83.9 kg)   SpO2 96%   BMI 28.13 kg/m    Wt Readings from Last 3 Encounters:  08/22/23 185 lb (83.9 kg)  02/11/23 182 lb (82.6 kg)  08/03/22 175 lb (79.4 kg)    GEN: Well nourished, well developed in no acute distress NECK: No JVD; No carotid bruits CARDIAC: RRR, no murmurs, no rubs, no gallops RESPIRATORY:  Clear to auscultation without rales, wheezing or rhonchi  ABDOMEN: Soft, non-tender, non-distended EXTREMITIES:  No edema; No deformity   ASSESSMENT AND PLAN: .    Assessment and Plan Assessment & Plan Coronary Artery Disease Coronary artery disease with occlusion of proximal RCA in 2021 and cardiac stent in circumflex artery in 2009. No current chest pain. - Refill nitroglycerin  prescription. - Emphasize medication adherence and regular follow-up for effective CAD management.  Hypertension Hypertension managed with multiple antihypertensive medications. - Switch metoprolol  tartrate 50 mg to metoprolol  succinate 50 mg for smoother control. - Adjust losartan  to 50 mg daily instead of 100 mg half tablet.  Diabetes Mellitus Diabetes mellitus with previous hemoglobin A1c of 6.8%. Current management includes Jardiance , beneficial for diabetes and heart health. - Continue Jardiance  25 mg daily.  Obstructive Sleep Apnea Obstructive sleep apnea managed with CPAP therapy.  Medication Management Discussed convenience of on-site pharmacy for medication  refills and home delivery options for easier access and potentially lower costs. - Refill medications at the on-site pharmacy and discuss home delivery options.         Dispo: 6 mth  Signed, Oneil Parchment, MD

## 2023-08-22 NOTE — Patient Instructions (Signed)
 Medication Instructions:  Please decrease Losartan  to 50 mg a day. Increase Jardiance  to 25 mg once a day. Take Metoprolol  Succinate 50 mg once a day. Continue all other medications as listed.  *If you need a refill on your cardiac medications before your next appointment, please call your pharmacy*  Follow-Up: At [facility], you and your health needs are our priority.  As part of our continuing mission to provide you with exceptional heart care, our providers are all part of one team.  This team includes your primary Cardiologist (physician) and Advanced Practice Providers or APPs (Physician Assistants and Nurse Practitioners) who all work together to provide you with the care you need, when you need it.  Your next appointment:   6 month(s)  Provider:   Oneil Parchment, MD    We recommend signing up for the patient portal called MyChart.  Sign up information is provided on this After Visit Summary.  MyChart is used to connect with patients for Virtual Visits (Telemedicine).  Patients are able to view lab/test results, encounter notes, upcoming appointments, etc.  Non-urgent messages can be sent to your provider as well.   To learn more about what you can do with MyChart, go to [email or web address].

## 2024-03-10 ENCOUNTER — Ambulatory Visit: Admitting: Cardiology
